# Patient Record
Sex: Female | Born: 1985 | Race: Black or African American | Hispanic: No | Marital: Single | Smoking: Current some day smoker
Health system: Southern US, Community
[De-identification: ages and names within clinical notes are randomized; demographics above are authoritative.]

## PROBLEM LIST (undated history)

## (undated) ENCOUNTER — Inpatient Hospital Stay (HOSPITAL_COMMUNITY): Payer: Self-pay

## (undated) DIAGNOSIS — A749 Chlamydial infection, unspecified: Secondary | ICD-10-CM

## (undated) DIAGNOSIS — Z789 Other specified health status: Secondary | ICD-10-CM

## (undated) HISTORY — PX: WISDOM TOOTH EXTRACTION: SHX21

---
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
## 2010-08-31 ENCOUNTER — Emergency Department (HOSPITAL_COMMUNITY)
Admission: EM | Admit: 2010-08-31 | Discharge: 2010-08-31 | Disposition: A | Discharge status: Home / Self Care | Payer: Self-pay | Attending: Emergency Medicine | Admitting: Emergency Medicine

## 2010-08-31 DIAGNOSIS — L259 Unspecified contact dermatitis, unspecified cause: Secondary | ICD-10-CM | POA: Insufficient documentation

## 2013-05-12 NOTE — L&D Delivery Note (Signed)
Delivery Note At 12:58 AM a viable female was delivered via Vaginal, Spontaneous Delivery (Presentation: ; Occiput Anterior).  APGAR: 8, 9; weight 8 lb 5.2 oz (3775 g).   Placenta status: Intact, Spontaneous.  Cord: 3 vessels with the following complications: None.  Anesthesia: Epidural  Episiotomy: None Lacerations: 2nd degree;Perineal Suture Repair: 3.0 vicryl Est. Blood Loss (mL): 250  Mom to postpartum.  Baby to Couplet care / Skin to Skin.  William Dalton 01/03/2014, 3:28 AM

## 2013-05-12 NOTE — L&D Delivery Note (Signed)
Attestation of Attending Supervision of Advanced Practitioner (CNM/NP): Evaluation and management procedures were performed by the Advanced Practitioner under my supervision and collaboration.  I have reviewed the Advanced Practitioner's note and chart, and I agree with the management and plan.  Damian Hofstra 01/09/2014 8:50 AM   

## 2013-08-04 ENCOUNTER — Inpatient Hospital Stay (HOSPITAL_COMMUNITY)
Admission: AD | Admit: 2013-08-04 | Discharge: 2013-08-04 | Disposition: A | Discharge status: Home / Self Care | Payer: Self-pay | Source: Ambulatory Visit | Attending: Obstetrics & Gynecology | Admitting: Obstetrics & Gynecology

## 2013-08-04 ENCOUNTER — Encounter (HOSPITAL_COMMUNITY): Payer: Self-pay | Admitting: *Deleted

## 2013-08-04 ENCOUNTER — Inpatient Hospital Stay (HOSPITAL_COMMUNITY)
Admission: AD | Admit: 2013-08-04 | Discharge: 2013-08-04 | Disposition: A | Discharge status: Home / Self Care | Payer: Medicaid Other | Source: Ambulatory Visit | Attending: Obstetrics & Gynecology | Admitting: Obstetrics & Gynecology

## 2013-08-04 DIAGNOSIS — O9989 Other specified diseases and conditions complicating pregnancy, childbirth and the puerperium: Principal | ICD-10-CM

## 2013-08-04 DIAGNOSIS — O99891 Other specified diseases and conditions complicating pregnancy: Secondary | ICD-10-CM | POA: Insufficient documentation

## 2013-08-04 DIAGNOSIS — Z3201 Encounter for pregnancy test, result positive: Secondary | ICD-10-CM

## 2013-08-04 DIAGNOSIS — N912 Amenorrhea, unspecified: Secondary | ICD-10-CM

## 2013-08-04 DIAGNOSIS — O99330 Smoking (tobacco) complicating pregnancy, unspecified trimester: Secondary | ICD-10-CM | POA: Insufficient documentation

## 2013-08-04 DIAGNOSIS — R109 Unspecified abdominal pain: Secondary | ICD-10-CM | POA: Insufficient documentation

## 2013-08-04 LAB — URINALYSIS, ROUTINE W REFLEX MICROSCOPIC
Bilirubin Urine: NEGATIVE
GLUCOSE, UA: NEGATIVE mg/dL
Hgb urine dipstick: NEGATIVE
KETONES UR: 15 mg/dL — AB
Leukocytes, UA: NEGATIVE
Nitrite: NEGATIVE
Protein, ur: NEGATIVE mg/dL
Specific Gravity, Urine: >1.03 — ABNORMAL HIGH (ref 1.005–1.030)
Urobilinogen, UA: 0.2 mg/dL (ref 0.0–1.0)
pH: 6 (ref 5.0–8.0)

## 2013-08-04 LAB — POCT PREGNANCY, URINE: Preg Test, Ur: POSITIVE — AB

## 2013-08-04 NOTE — Discharge Instructions (Signed)
Pregnancy Tests HOW DO PREGNANCY TESTS WORK? All pregnancy tests look for a special hormone in the urine or blood that is only present in pregnant women. This hormone, human chorionic gonadotropin (hCG), is also called the pregnancy hormone.  WHAT IS THE DIFFERENCE BETWEEN A URINE AND A BLOOD PREGNANCY TEST? IS ONE BETTER THAN THE OTHER? There are two types of pregnancy tests.  Blood tests.  Urine tests. Both tests look for the presence of hCG, the pregnancy hormone. Many women use a urine test or home pregnancy test (HPT) to find out if they are pregnant. HPTs are cheap, easy to use, can be done at home, and are private. When a woman has a positive result on an HPT, she needs to see her caregiver right away. The caregiver can confirm a positive HPT result with another urine test, a blood test, ultrasound, and a pelvic exam.  There are two types of blood tests you can get from a caregiver.   A quantitative blood test (or the beta hCG test). This test measures the exact amount of hCG in the blood. This means it can pick up very small amounts of hCG, making it a very accurate test.  A qualitative hCG blood test. This test gives a simple yes or no answer to whether you are pregnant. This test is more like a urine test in terms of its accuracy. Blood tests can pick up hCG earlier in a pregnancy than urine tests can. Blood tests can tell if you are pregnant about 6 to 8 days after you release an egg from an ovary (ovulate). Urine tests can determine pregnancy about 2 weeks after ovulation.  HOW IS A HOME PREGNANCY TEST DONE?  There are many types of home pregnancy tests or HPTs that can be bought over-the-counter at drug or discount stores.   Some involve collecting your urine in a cup and dipping a stick into the urine or putting some of the urine into a special container with an eyedropper.  Others are done by placing a stick into your urine stream.  Tests vary in how long you need to wait for  the stick or container to turn a certain color or have a symbol on it (like a plus or a minus).  All tests come with written instructions. Most tests also have toll-free phone numbers to call if you have any questions about how to do the test or read the results. HOW ACCURATE ARE HOME PREGNANCY TESTS?  HPTs are very accurate. Most brands of HPTs say they are 97% to 99% accurate when taken 1 week after missing your menstrual period, but this can vary with actual use. Each brand varies in how sensitive it is in picking up the pregnancy hormone hCG. If a test is not done correctly, it will be less accurate. Always check the package to make sure it is not past its expiration date. If it is, it will not be accurate. Most brands of HPTs tell users to do the test again in a few days, no matter what the results.  If you use an HPT too early in your pregnancy, you may not have enough of the pregnancy hormone hCG in your urine to have a positive test result. Most HPTs will be accurate if you test yourself around the time your period is due (about 2 weeks after you ovulate). You can get a negative test result if you are not pregnant or if you ovulated later than you thought you did.  You may also have problems with the pregnancy, which affects the amount of hCG you have in your urine. If your HPT is negative, test yourself again within a few days to 1 week. If you keep getting a negative result and think you are pregnant, talk with your caregiver right away about getting a blood pregnancy test.  FALSE POSITIVE PREGNANCY TEST A false positive HPT can happen if there is blood or protein present in your urine. A false positive can also happen if you were recently pregnant or if you take a pregnancy test too soon after taking fertility drug that contains hCG. Also, some prescription medicines such as water pills (diuretics), tranquilizers, seizure medicines, psychiatric medicines, and allergy and nausea medicines  (promethazine) give false positive readings. FALSE NEGATIVE PREGNANCY TEST  A false negative HPT can happen if you do the test too early. Try to wait until you are at least 1 day late for your menstrual period.  It may happen if you wait too long to test the urine (longer than 15 minutes).  It may also happen if the urine is too diluted because you drank a lot of fluids before getting the urine sample. It is best to test the first morning urine after you get out of bed. If your menstrual period did not start after a week of a negative HPT, repeat the pregnancy test. CAN ANYTHING INTERFERE WITH HOME PREGNANCY TEST RESULTS?  Most medicines, both over-the-counter and prescription drugs, including birth control pills and antibiotics, should not affect the results of a HPT. Only those drugs that have the pregnancy hormone hCG in them can give a false positive test result. Drugs that have hCG in them may be used for treating infertility (not being able to get pregnant). Alcohol and illegal drugs do not affect HPT results, but you should not be using these substances if you are trying to get pregnant. If you have a positive pregnancy test, call your caregiver to make an appointment to begin prenatal care. Document Released: 05/01/2003 Document Revised: 07/21/2011 Document Reviewed: 07/12/2010 [facility] Patient Information 2014 [place], [place].  Second Trimester of Pregnancy The second trimester is from week 13 through week 28, months 4 through 6. The second trimester is often a time when you feel your best. Your body has also adjusted to being pregnant, and you begin to feel better physically. Usually, morning sickness has lessened or quit completely, you may have more energy, and you may have an increase in appetite. The second trimester is also a time when the fetus is growing rapidly. At the end of the sixth month, the fetus is about 9 inches long and weighs about 1 pounds. You will likely begin to feel  the baby move (quickening) between 18 and 20 weeks of the pregnancy. BODY CHANGES Your body goes through many changes during pregnancy. The changes vary from woman to woman.   Your weight will continue to increase. You will notice your lower abdomen bulging out.  You may begin to get stretch marks on your hips, abdomen, and breasts.  You may develop headaches that can be relieved by medicines approved by your caregiver.  You may urinate more often because the fetus is pressing on your bladder.  You may develop or continue to have heartburn as a result of your pregnancy.  You may develop constipation because certain hormones are causing the muscles that push waste through your intestines to slow down.  You may develop hemorrhoids or swollen, bulging veins (varicose  veins).  You may have back pain because of the weight gain and pregnancy hormones relaxing your joints between the bones in your pelvis and as a result of a shift in weight and the muscles that support your balance.  Your breasts will continue to grow and be tender.  Your gums may bleed and may be sensitive to brushing and flossing.  Dark spots or blotches (chloasma, mask of pregnancy) may develop on your face. This will likely fade after the baby is born.  A dark line from your belly button to the pubic area (linea nigra) may appear. This will likely fade after the baby is born. WHAT TO EXPECT AT YOUR PRENATAL VISITS During a routine prenatal visit:  You will be weighed to make sure you and the fetus are growing normally.  Your blood pressure will be taken.  Your abdomen will be measured to track your baby's growth.  The fetal heartbeat will be listened to.  Any test results from the previous visit will be discussed. Your caregiver may ask you:  How you are feeling.  If you are feeling the baby move.  If you have had any abnormal symptoms, such as leaking fluid, bleeding, severe headaches, or abdominal  cramping.  If you have any questions. Other tests that may be performed during your second trimester include:  Blood tests that check for:  Low iron levels (anemia).  Gestational diabetes (between 24 and 28 weeks).  Rh antibodies.  Urine tests to check for infections, diabetes, or protein in the urine.  An ultrasound to confirm the proper growth and development of the baby.  An amniocentesis to check for possible genetic problems.  Fetal screens for spina bifida and Down syndrome. HOME CARE INSTRUCTIONS   Avoid all smoking, herbs, alcohol, and unprescribed drugs. These chemicals affect the formation and growth of the baby.  Follow your caregiver's instructions regarding medicine use. There are medicines that are either safe or unsafe to take during pregnancy.  Exercise only as directed by your caregiver. Experiencing uterine cramps is a good sign to stop exercising.  Continue to eat regular, healthy meals.  Wear a good support bra for breast tenderness.  Do not use hot tubs, steam rooms, or saunas.  Wear your seat belt at all times when driving.  Avoid raw meat, uncooked cheese, cat litter boxes, and soil used by cats. These carry germs that can cause birth defects in the baby.  Take your prenatal vitamins.  Try taking a stool softener (if your caregiver approves) if you develop constipation. Eat more high-fiber foods, such as fresh vegetables or fruit and whole grains. Drink plenty of fluids to keep your urine clear or pale yellow.  Take warm sitz baths to soothe any pain or discomfort caused by hemorrhoids. Use hemorrhoid cream if your caregiver approves.  If you develop varicose veins, wear support hose. Elevate your feet for 15 minutes, 3 4 times a day. Limit salt in your diet.  Avoid heavy lifting, wear low heel shoes, and practice good posture.  Rest with your legs elevated if you have leg cramps or low back pain.  Visit your dentist if you have not gone yet  during your pregnancy. Use a soft toothbrush to brush your teeth and be gentle when you floss.  A sexual relationship may be continued unless your caregiver directs you otherwise.  Continue to go to all your prenatal visits as directed by your caregiver. SEEK MEDICAL CARE IF:   You have dizziness.  You have mild pelvic cramps, pelvic pressure, or nagging pain in the abdominal area.  You have persistent nausea, vomiting, or diarrhea.  You have a bad smelling vaginal discharge.  You have pain with urination. SEEK IMMEDIATE MEDICAL CARE IF:   You have a fever.  You are leaking fluid from your vagina.  You have spotting or bleeding from your vagina.  You have severe abdominal cramping or pain.  You have rapid weight gain or loss.  You have shortness of breath with chest pain.  You notice sudden or extreme swelling of your face, hands, ankles, feet, or legs.  You have not felt your baby move in over an hour.  You have severe headaches that do not go away with medicine.  You have vision changes. Document Released: 04/22/2001 Document Revised: 12/29/2012 Document Reviewed: 06/29/2012 [facility] Patient Information 2014 [place], [place].

## 2013-08-04 NOTE — MAU Provider Note (Signed)
  History     CSN: [id number]  Arrival date and time: 08/04/13 1439   First Provider Initiated Contact with Patient 08/04/13 1510      Chief Complaint  Patient presents with  . Amenorrhea   HPI This is a 28 y.o. female at 8117 wks by LMP who presents not knowing she was pregnant, but c/o no period since November. UPTs at home neg, but has not done one lately . Is here mainly "to get a pap smear and breast exam [facility]".  Declines STD testing or pelvic exam right now. Tells me she has no pain or bleeding.   RN Note:  Patient states she has not had a period since November. Has done 2 pregnancy tests at home but are negative. Has mild cramping, no bleeding or discharge.        OB History   Grav Para Term Preterm Abortions TAB SAB Ect Mult Living   3    2  2          History reviewed. No pertinent past medical history.  Past Surgical History  Procedure Laterality Date  . Wisdom tooth extraction      Family History  Problem Relation Age of Onset  . Hypertension Mother   . Diabetes Maternal Grandmother   . Hypertension Maternal Grandmother     History  Substance Use Topics  . Smoking status: Current Some Day Smoker    Types: Cigarettes  . Smokeless tobacco: Not on file  . Alcohol Use: No    Allergies: No Known Allergies  No prescriptions prior to admission    Review of Systems  Constitutional: Negative for fever, chills and malaise/fatigue.  Gastrointestinal: Negative for nausea, vomiting, abdominal pain, diarrhea and constipation.  Genitourinary: Negative for dysuria.  Neurological: Negative for dizziness and headaches.   Physical Exam   Blood pressure 123/78, pulse 101, temperature 98.3 F (36.8 C), temperature source Oral, resp. rate 18, height 5' 4.5" (1.638 m), weight 98.249 kg (216 lb 9.6 oz), last menstrual period 04/07/2013, SpO2 100.00%.  Physical Exam  Constitutional: She is oriented to person,  place, and time. She appears well-developed and well-nourished. No distress.  HENT:  Head: Normocephalic.  Cardiovascular: Normal rate.   Respiratory: Effort normal.  GI: Soft. There is no tenderness. There is no rebound and no guarding.  Genitourinary:  Pelvic exam declined  Musculoskeletal: Normal range of motion.  Neurological: She is alert and oriented to person, place, and time.  Skin: Skin is warm and dry.  Psychiatric: She has a normal mood and affect.    MAU Course  Procedures  MDM FHTs audible with doppler.   Assessment and Plan  A:  SIUP at 6740w0d by LMP       + fetal heart tones        Unsure dates  P:   US scheduled for 08/11/13 at 2pm        Will get her into clinic for prenatal care        Proof of pregnancy letter given  [facility] LLC[facility]ILLIAMS,MARIE 08/04/2013, 3:27 PM

## 2013-08-04 NOTE — MAU Note (Signed)
Patient states she has not had a period since November. Has done 2 pregnancy tests at home but are negative. Has mild cramping, no bleeding or discharge.

## 2013-08-05 NOTE — MAU Provider Note (Signed)
Attestation of Attending Supervision of Advanced Practitioner (CNM/NP): Evaluation and management procedures were performed by the Advanced Practitioner under my supervision and collaboration.  I have reviewed the Advanced Practitioner's note and chart, and I agree with the management and plan.  HARRAWAY-SMITH, Zedekiah Hinderman 11:03 AM     

## 2013-08-09 ENCOUNTER — Ambulatory Visit (INDEPENDENT_AMBULATORY_CARE_PROVIDER_SITE_OTHER): Payer: Medicaid Other | Admitting: Advanced Practice Midwife

## 2013-08-09 ENCOUNTER — Encounter: Payer: Self-pay | Admitting: Advanced Practice Midwife

## 2013-08-09 VITALS — BP 131/81 | Temp 99.1°F | Wt 216.0 lb

## 2013-08-09 DIAGNOSIS — Z23 Encounter for immunization: Secondary | ICD-10-CM

## 2013-08-09 DIAGNOSIS — Z3490 Encounter for supervision of normal pregnancy, unspecified, unspecified trimester: Secondary | ICD-10-CM

## 2013-08-09 DIAGNOSIS — Z3480 Encounter for supervision of other normal pregnancy, unspecified trimester: Secondary | ICD-10-CM

## 2013-08-09 LAB — POCT URINALYSIS DIP (DEVICE)
BILIRUBIN URINE: NEGATIVE
GLUCOSE, UA: NEGATIVE mg/dL
Hgb urine dipstick: NEGATIVE
Nitrite: NEGATIVE
Protein, ur: 30 mg/dL — AB
SPECIFIC GRAVITY, URINE: 1.02 (ref 1.005–1.030)
Urobilinogen, UA: 0.2 mg/dL (ref 0.0–1.0)
pH: 7 (ref 5.0–8.0)

## 2013-08-09 LAB — GLUCOSE TOLERANCE, 1 HOUR (50G) W/O FASTING: Glucose, 1 Hour GTT: 95 mg/dL (ref 70–140)

## 2013-08-09 LAB — OB RESULTS CONSOLE GC/CHLAMYDIA
Chlamydia: POSITIVE
GC PROBE AMP, GENITAL: NEGATIVE

## 2013-08-09 MED ORDER — PRENATAL VITAMINS 28-0.8 MG PO TABS: 1.0000 | ORAL_TABLET | Freq: Every day | ORAL | Status: DC

## 2013-08-09 NOTE — Progress Notes (Signed)
   Subjective:    Meagan Stephens is a [id number] [id number] being seen today for her first obstetrical visit.  Her obstetrical history is significant for TAB x2 in 2012.  This pregnancy is unplanned, pt unaware of pregnancy until MAU visit at 17 weeks. . Patient is unsure if she intends to breast feed. Pregnancy history fully reviewed.  Patient reports no complaints.  Filed Vitals:   08/09/13 1314  BP: 131/81  Temp: 99.1 F (37.3 C)  Weight: 216 lb (97.977 kg)    HISTORY: OB History  Gravida Para Term Preterm AB SAB TAB Ectopic Multiple Living  3    2 0 2       # Outcome Date GA Lbr Len/2nd Weight Sex Delivery Anes PTL Lv  3 CUR           2 TAB 2012          1 TAB 2012             History reviewed. No pertinent past medical history. Past Surgical History  Procedure Laterality Date  . Wisdom tooth extraction     Family History  Problem Relation Age of Onset  . Hypertension Mother   . Diabetes Maternal Grandmother   . Hypertension Maternal Grandmother      Exam    Uterus:   1 cm below umbilicus  Pelvic Exam:    Perineum: No Hemorrhoids, Normal Perineum   Vulva: normal   Vagina:  normal mucosa, normal discharge   pH:    Cervix: no bleeding following Pap, no cervical motion tenderness, no lesions and nulliparous appearance   Adnexa: normal adnexa and no mass, fullness, tenderness   Bony Pelvis: average  System: Breast:  normal appearance, no masses or tenderness, Normal to palpation without dominant masses, Taught monthly breast self examination   Skin: normal coloration and turgor, no rashes    Neurologic: oriented, normal, gait normal; reflexes normal and symmetric   Extremities: normal strength, tone, and muscle mass, ROM of all joints is normal   HEENT neck supple with midline trachea and thyroid without masses   Mouth/Teeth mucous membranes moist, pharynx normal without lesions   Neck supple and no masses   Cardiovascular: regular rate and rhythm   Respiratory:   appears well, vitals normal, no respiratory distress, acyanotic, normal RR, ear and throat exam is normal, neck free of mass or lymphadenopathy, chest clear, no wheezing, crepitations, rhonchi, normal symmetric air entry   Abdomen: soft, non-tender; bowel sounds normal; no masses,  no organomegaly   Urinary: urethral meatus normal      Assessment:    Pregnancy: [id number] Patient Active Problem List   Diagnosis Date Noted  . Flu vaccine need 08/09/2013  . Supervision of normal pregnancy 08/09/2013        Plan:     Initial labs drawn. Prenatal vitamins. Problem list reviewed and updated. Genetic Screening discussed Quad Screen: declined.  Ultrasound discussed; fetal survey: ordered.  Follow up in 4 weeks. 50% of 30 min visit spent on counseling and coordination of care.     Stephens, Meagan Raboin 08/09/2013

## 2013-08-09 NOTE — Progress Notes (Signed)
9=97 initial prenatal visit, new pamphlet given. Labs today.

## 2013-08-10 ENCOUNTER — Other Ambulatory Visit: Payer: Self-pay | Admitting: Advanced Practice Midwife

## 2013-08-10 ENCOUNTER — Encounter: Payer: Self-pay | Admitting: Advanced Practice Midwife

## 2013-08-10 DIAGNOSIS — A749 Chlamydial infection, unspecified: Secondary | ICD-10-CM | POA: Insufficient documentation

## 2013-08-10 DIAGNOSIS — O98812 Other maternal infectious and parasitic diseases complicating pregnancy, second trimester: Secondary | ICD-10-CM

## 2013-08-10 LAB — OBSTETRIC PANEL
ANTIBODY SCREEN: NEGATIVE
Basophils Absolute: 0 10*3/uL (ref 0.0–0.1)
Basophils Relative: 0 % (ref 0–1)
EOS PCT: 1 % (ref 0–5)
Eosinophils Absolute: 0.1 10*3/uL (ref 0.0–0.7)
HEMATOCRIT: 34.4 % — AB (ref 36.0–46.0)
HEMOGLOBIN: 11.2 g/dL — AB (ref 12.0–15.0)
Hepatitis B Surface Ag: NEGATIVE
LYMPHS ABS: 2 10*3/uL (ref 0.7–4.0)
LYMPHS PCT: 25 % (ref 12–46)
MCH: 27.6 pg (ref 26.0–34.0)
MCHC: 32.6 g/dL (ref 30.0–36.0)
MCV: 84.7 fL (ref 78.0–100.0)
MONO ABS: 0.5 10*3/uL (ref 0.1–1.0)
Monocytes Relative: 6 % (ref 3–12)
Neutro Abs: 5.4 10*3/uL (ref 1.7–7.7)
Neutrophils Relative %: 68 % (ref 43–77)
Platelets: 217 10*3/uL (ref 150–400)
RBC: 4.06 MIL/uL (ref 3.87–5.11)
RDW: 13.8 % (ref 11.5–15.5)
RUBELLA: 2.73 {index} — AB (ref <0.90)
Rh Type: POSITIVE
WBC: 8 10*3/uL (ref 4.0–10.5)

## 2013-08-10 LAB — HIV ANTIBODY (ROUTINE TESTING W REFLEX): HIV: NONREACTIVE

## 2013-08-10 MED ORDER — AZITHROMYCIN 500 MG PO TABS: 1000.0000 mg | ORAL_TABLET | Freq: Once | ORAL | Status: DC

## 2013-08-11 ENCOUNTER — Ambulatory Visit (HOSPITAL_COMMUNITY)
Admit: 2013-08-11 | Discharge: 2013-08-11 | Disposition: A | Discharge status: Home / Self Care | Payer: Medicaid Other | Attending: Advanced Practice Midwife | Admitting: Advanced Practice Midwife

## 2013-08-11 DIAGNOSIS — N912 Amenorrhea, unspecified: Secondary | ICD-10-CM

## 2013-08-11 DIAGNOSIS — Z3689 Encounter for other specified antenatal screening: Secondary | ICD-10-CM | POA: Diagnosis present

## 2013-08-11 LAB — CULTURE, OB URINE

## 2013-08-11 LAB — HEMOGLOBINOPATHY EVALUATION
HGB F QUANT: 0 % (ref 0.0–2.0)
HGB S QUANTITAION: 0 %
Hemoglobin Other: 0 %
Hgb A2 Quant: 2.4 % (ref 2.2–3.2)
Hgb A: 97.6 % (ref 96.8–97.8)

## 2013-08-11 NOTE — Progress Notes (Signed)
Attempted to call pt. No answer. Left message stating we are calling with results and of information of a RX we have had sent to your pharmacy, please call clinic.

## 2013-08-12 ENCOUNTER — Encounter: Payer: Self-pay | Admitting: Advanced Practice Midwife

## 2013-08-16 LAB — PRESCRIPTION MONITORING PROFILE (19 PANEL)
Amphetamine/Meth: NEGATIVE ng/mL
BARBITURATE SCREEN, URINE: NEGATIVE ng/mL
BENZODIAZEPINE SCREEN, URINE: NEGATIVE ng/mL
Buprenorphine, Urine: NEGATIVE ng/mL
CARISOPRODOL, URINE: NEGATIVE ng/mL
COCAINE METABOLITES: NEGATIVE ng/mL
Creatinine, Urine: 305.95 mg/dL (ref >20.0)
ECSTASY: NEGATIVE ng/mL
Fentanyl, Ur: NEGATIVE ng/mL
Meperidine, Ur: NEGATIVE ng/mL
Methadone Screen, Urine: NEGATIVE ng/mL
Methaqualone: NEGATIVE ng/mL
Nitrites, Initial: NEGATIVE ug/mL
OPIATE SCREEN, URINE: NEGATIVE ng/mL
Oxycodone Screen, Ur: NEGATIVE ng/mL
Phencyclidine, Ur: NEGATIVE ng/mL
Propoxyphene: NEGATIVE ng/mL
TRAMADOL UR: NEGATIVE ng/mL
Tapentadol, urine: NEGATIVE ng/mL
Zolpidem, Urine: NEGATIVE ng/mL
pH, Initial: 8 pH (ref 4.5–8.9)

## 2013-08-16 LAB — CANNABANOIDS (GC/LC/MS), URINE: THC-COOH UR CONFIRM: 83 ng/mL — AB

## 2013-08-16 NOTE — Progress Notes (Signed)
Called pt. And informed her of positive chlamydia. Pt. States she already picked up azithromycin and took it. Informed pt. Not to have sexual intercourse until partner is treated. Pt. Verbalized understanding and gratitude and had no further questions or concerns.

## 2013-09-06 ENCOUNTER — Encounter: Payer: Self-pay | Admitting: Obstetrics and Gynecology

## 2013-09-06 ENCOUNTER — Ambulatory Visit (INDEPENDENT_AMBULATORY_CARE_PROVIDER_SITE_OTHER): Payer: Medicaid Other | Admitting: Obstetrics and Gynecology

## 2013-09-06 VITALS — BP 121/83 | HR 109 | Temp 98.2°F | Wt 214.8 lb

## 2013-09-06 DIAGNOSIS — Z3490 Encounter for supervision of normal pregnancy, unspecified, unspecified trimester: Secondary | ICD-10-CM

## 2013-09-06 DIAGNOSIS — Z3480 Encounter for supervision of other normal pregnancy, unspecified trimester: Secondary | ICD-10-CM

## 2013-09-06 LAB — POCT URINALYSIS DIP (DEVICE)
Bilirubin Urine: NEGATIVE
Glucose, UA: NEGATIVE mg/dL
HGB URINE DIPSTICK: NEGATIVE
Ketones, ur: 15 mg/dL — AB
Nitrite: NEGATIVE
PROTEIN: NEGATIVE mg/dL
SPECIFIC GRAVITY, URINE: 1.02 (ref 1.005–1.030)
Urobilinogen, UA: 0.2 mg/dL (ref 0.0–1.0)
pH: 6.5 (ref 5.0–8.0)

## 2013-09-06 NOTE — Progress Notes (Signed)
C/o panties wet 2 days ago- felt like leaked. Also c/o when brushes teeth- makes her gag- we discussed not brushing tongue, and avoiding going to back of throat.

## 2013-09-06 NOTE — Patient Instructions (Signed)
Second Trimester of Pregnancy The second trimester is from week 13 through week 28, months 4 through 6. The second trimester is often a time when you feel your best. Your body has also adjusted to being pregnant, and you begin to feel better physically. Usually, morning sickness has lessened or quit completely, you may have more energy, and you may have an increase in appetite. The second trimester is also a time when the fetus is growing rapidly. At the end of the sixth month, the fetus is about 9 inches long and weighs about 1 pounds. You will likely begin to feel the baby move (quickening) between 18 and 20 weeks of the pregnancy. BODY CHANGES Your body goes through many changes during pregnancy. The changes vary from woman to woman.   Your weight will continue to increase. You will notice your lower abdomen bulging out.  You may begin to get stretch marks on your hips, abdomen, and breasts.  You may develop headaches that can be relieved by medicines approved by your caregiver.  You may urinate more often because the fetus is pressing on your bladder.  You may develop or continue to have heartburn as a result of your pregnancy.  You may develop constipation because certain hormones are causing the muscles that push waste through your intestines to slow down.  You may develop hemorrhoids or swollen, bulging veins (varicose veins).  You may have back pain because of the weight gain and pregnancy hormones relaxing your joints between the bones in your pelvis and as a result of a shift in weight and the muscles that support your balance.  Your breasts will continue to grow and be tender.  Your gums may bleed and may be sensitive to brushing and flossing.  Dark spots or blotches (chloasma, mask of pregnancy) may develop on your face. This will likely fade after the baby is born.  A dark line from your belly button to the pubic area (linea nigra) may appear. This will likely fade after the  baby is born. WHAT TO EXPECT AT YOUR PRENATAL VISITS During a routine prenatal visit:  You will be weighed to make sure you and the fetus are growing normally.  Your blood pressure will be taken.  Your abdomen will be measured to track your baby's growth.  The fetal heartbeat will be listened to.  Any test results from the previous visit will be discussed. Your caregiver may ask you:  How you are feeling.  If you are feeling the baby move.  If you have had any abnormal symptoms, such as leaking fluid, bleeding, severe headaches, or abdominal cramping.  If you have any questions. Other tests that may be performed during your second trimester include:  Blood tests that check for:  Low iron levels (anemia).  Gestational diabetes (between 24 and 28 weeks).  Rh antibodies.  Urine tests to check for infections, diabetes, or protein in the urine.  An ultrasound to confirm the proper growth and development of the baby.  An amniocentesis to check for possible genetic problems.  Fetal screens for spina bifida and Down syndrome. HOME CARE INSTRUCTIONS   Avoid all smoking, herbs, alcohol, and unprescribed drugs. These chemicals affect the formation and growth of the baby.  Follow your caregiver's instructions regarding medicine use. There are medicines that are either safe or unsafe to take during pregnancy.  Exercise only as directed by your caregiver. Experiencing uterine cramps is a good sign to stop exercising.  Continue to eat regular,   healthy meals.  Wear a good support bra for breast tenderness.  Do not use hot tubs, steam rooms, or saunas.  Wear your seat belt at all times when driving.  Avoid raw meat, uncooked cheese, cat litter boxes, and soil used by cats. These carry germs that can cause birth defects in the baby.  Take your prenatal vitamins.  Try taking a stool softener (if your caregiver approves) if you develop constipation. Eat more high-fiber foods,  such as fresh vegetables or fruit and whole grains. Drink plenty of fluids to keep your urine clear or pale yellow.  Take warm sitz baths to soothe any pain or discomfort caused by hemorrhoids. Use hemorrhoid cream if your caregiver approves.  If you develop varicose veins, wear support hose. Elevate your feet for 15 minutes, 3 4 times a day. Limit salt in your diet.  Avoid heavy lifting, wear low heel shoes, and practice good posture.  Rest with your legs elevated if you have leg cramps or low back pain.  Visit your dentist if you have not gone yet during your pregnancy. Use a soft toothbrush to brush your teeth and be gentle when you floss.  A sexual relationship may be continued unless your caregiver directs you otherwise.  Continue to go to all your prenatal visits as directed by your caregiver. SEEK MEDICAL CARE IF:   You have dizziness.  You have mild pelvic cramps, pelvic pressure, or nagging pain in the abdominal area.  You have persistent nausea, vomiting, or diarrhea.  You have a bad smelling vaginal discharge.  You have pain with urination. SEEK IMMEDIATE MEDICAL CARE IF:   You have a fever.  You are leaking fluid from your vagina.  You have spotting or bleeding from your vagina.  You have severe abdominal cramping or pain.  You have rapid weight gain or loss.  You have shortness of breath with chest pain.  You notice sudden or extreme swelling of your face, hands, ankles, feet, or legs.  You have not felt your baby move in over an hour.  You have severe headaches that do not go away with medicine.  You have vision changes. Document Released: 04/22/2001 Document Revised: 12/29/2012 Document Reviewed: 06/29/2012 [employer] Patient Information 2014 [employer].  

## 2013-09-06 NOTE — Progress Notes (Signed)
Wetness in underwear 2 d ago, not since. SSE: no pooling, white discharge. CG/CT done for TOC.  Explained EDD based on US and long discussion re paternity. Reviewed US with pt. Discussed diet.

## 2013-09-07 LAB — GC/CHLAMYDIA PROBE AMP
CT Probe RNA: NEGATIVE
GC Probe RNA: NEGATIVE

## 2013-09-12 ENCOUNTER — Telehealth: Payer: Self-pay

## 2013-09-12 NOTE — Telephone Encounter (Signed)
Pt called and stated that she needed a refill on her prenatal vitamins.  I called pt and left message stating that prenatals vitamins are at her [facility] she go and pick them up.

## 2013-09-27 ENCOUNTER — Encounter: Payer: Self-pay | Admitting: Family Medicine

## 2013-09-27 ENCOUNTER — Telehealth: Payer: Self-pay | Admitting: *Deleted

## 2013-09-27 NOTE — Telephone Encounter (Signed)
Pt left message stating that she needs a letter for her job which states her restrictions. She states that she is not able to stand for long periods of time and wants that in the letter. Her next appt is 5/26 but needs the letter before then.

## 2013-10-04 ENCOUNTER — Ambulatory Visit (INDEPENDENT_AMBULATORY_CARE_PROVIDER_SITE_OTHER): Payer: Medicaid Other | Admitting: Family Medicine

## 2013-10-04 ENCOUNTER — Encounter: Payer: Self-pay | Admitting: Family Medicine

## 2013-10-04 VITALS — BP 122/78 | HR 101 | Temp 98.3°F | Wt 216.5 lb

## 2013-10-04 DIAGNOSIS — A749 Chlamydial infection, unspecified: Secondary | ICD-10-CM

## 2013-10-04 DIAGNOSIS — O98812 Other maternal infectious and parasitic diseases complicating pregnancy, second trimester: Secondary | ICD-10-CM

## 2013-10-04 DIAGNOSIS — Z3480 Encounter for supervision of other normal pregnancy, unspecified trimester: Secondary | ICD-10-CM

## 2013-10-04 DIAGNOSIS — O98819 Other maternal infectious and parasitic diseases complicating pregnancy, unspecified trimester: Secondary | ICD-10-CM

## 2013-10-04 DIAGNOSIS — Z3490 Encounter for supervision of normal pregnancy, unspecified, unspecified trimester: Secondary | ICD-10-CM

## 2013-10-04 LAB — POCT URINALYSIS DIP (DEVICE)
Bilirubin Urine: NEGATIVE
Glucose, UA: NEGATIVE mg/dL
HGB URINE DIPSTICK: NEGATIVE
Ketones, ur: NEGATIVE mg/dL
NITRITE: NEGATIVE
PH: 6 (ref 5.0–8.0)
Protein, ur: NEGATIVE mg/dL
Specific Gravity, Urine: 1.01 (ref 1.005–1.030)
UROBILINOGEN UA: 0.2 mg/dL (ref 0.0–1.0)

## 2013-10-04 NOTE — Progress Notes (Signed)
Pt. Cannot stay for an hour today to have 1hr gtt and labs drawn as she has family with broken leg waiting in that car-- to schedule lab appointment for this Thursday to have all labs done.

## 2013-10-04 NOTE — Progress Notes (Signed)
F/u US scheduled for heart views

## 2013-10-04 NOTE — Progress Notes (Signed)
S: 28 yo [id number] @ [redacted]w[redacted]d here for ROBV  - doing well.  - unable to stay for 1 hour today but can come back in 2 days - no ctx, lof, vb. +FM  O: see flowsheet  A/P - doing well - PTL precautions discussed - f/u in 2 days for 28 week labs and 2 weeks for visit

## 2013-10-04 NOTE — Patient Instructions (Signed)
Second Trimester of Pregnancy The second trimester is from week 13 through week 28, months 4 through 6. The second trimester is often a time when you feel your best. Your body has also adjusted to being pregnant, and you begin to feel better physically. Usually, morning sickness has lessened or quit completely, you may have more energy, and you may have an increase in appetite. The second trimester is also a time when the fetus is growing rapidly. At the end of the sixth month, the fetus is about 9 inches long and weighs about 1 pounds. You will likely begin to feel the baby move (quickening) between 18 and 20 weeks of the pregnancy. BODY CHANGES Your body goes through many changes during pregnancy. The changes vary from woman to woman.   Your weight will continue to increase. You will notice your lower abdomen bulging out.  You may begin to get stretch marks on your hips, abdomen, and breasts.  You may develop headaches that can be relieved by medicines approved by your caregiver.  You may urinate more often because the fetus is pressing on your bladder.  You may develop or continue to have heartburn as a result of your pregnancy.  You may develop constipation because certain hormones are causing the muscles that push waste through your intestines to slow down.  You may develop hemorrhoids or swollen, bulging veins (varicose veins).  You may have back pain because of the weight gain and pregnancy hormones relaxing your joints between the bones in your pelvis and as a result of a shift in weight and the muscles that support your balance.  Your breasts will continue to grow and be tender.  Your gums may bleed and may be sensitive to brushing and flossing.  Dark spots or blotches (chloasma, mask of pregnancy) may develop on your face. This will likely fade after the baby is born.  A dark line from your belly button to the pubic area (linea nigra) may appear. This will likely fade after the  baby is born. WHAT TO EXPECT AT YOUR PRENATAL VISITS During a routine prenatal visit:  You will be weighed to make sure you and the fetus are growing normally.  Your blood pressure will be taken.  Your abdomen will be measured to track your baby's growth.  The fetal heartbeat will be listened to.  Any test results from the previous visit will be discussed. Your caregiver may ask you:  How you are feeling.  If you are feeling the baby move.  If you have had any abnormal symptoms, such as leaking fluid, bleeding, severe headaches, or abdominal cramping.  If you have any questions. Other tests that may be performed during your second trimester include:  Blood tests that check for:  Low iron levels (anemia).  Gestational diabetes (between 24 and 28 weeks).  Rh antibodies.  Urine tests to check for infections, diabetes, or protein in the urine.  An ultrasound to confirm the proper growth and development of the baby.  An amniocentesis to check for possible genetic problems.  Fetal screens for spina bifida and Down syndrome. HOME CARE INSTRUCTIONS   Avoid all smoking, herbs, alcohol, and unprescribed drugs. These chemicals affect the formation and growth of the baby.  Follow your caregiver's instructions regarding medicine use. There are medicines that are either safe or unsafe to take during pregnancy.  Exercise only as directed by your caregiver. Experiencing uterine cramps is a good sign to stop exercising.  Continue to eat regular,   healthy meals.  Wear a good support bra for breast tenderness.  Do not use hot tubs, steam rooms, or saunas.  Wear your seat belt at all times when driving.  Avoid raw meat, uncooked cheese, cat litter boxes, and soil used by cats. These carry germs that can cause birth defects in the baby.  Take your prenatal vitamins.  Try taking a stool softener (if your caregiver approves) if you develop constipation. Eat more high-fiber foods,  such as fresh vegetables or fruit and whole grains. Drink plenty of fluids to keep your urine clear or pale yellow.  Take warm sitz baths to soothe any pain or discomfort caused by hemorrhoids. Use hemorrhoid cream if your caregiver approves.  If you develop varicose veins, wear support hose. Elevate your feet for 15 minutes, 3 4 times a day. Limit salt in your diet.  Avoid heavy lifting, wear low heel shoes, and practice good posture.  Rest with your legs elevated if you have leg cramps or low back pain.  Visit your dentist if you have not gone yet during your pregnancy. Use a soft toothbrush to brush your teeth and be gentle when you floss.  A sexual relationship may be continued unless your caregiver directs you otherwise.  Continue to go to all your prenatal visits as directed by your caregiver. SEEK MEDICAL CARE IF:   You have dizziness.  You have mild pelvic cramps, pelvic pressure, or nagging pain in the abdominal area.  You have persistent nausea, vomiting, or diarrhea.  You have a bad smelling vaginal discharge.  You have pain with urination. SEEK IMMEDIATE MEDICAL CARE IF:   You have a fever.  You are leaking fluid from your vagina.  You have spotting or bleeding from your vagina.  You have severe abdominal cramping or pain.  You have rapid weight gain or loss.  You have shortness of breath with chest pain.  You notice sudden or extreme swelling of your face, hands, ankles, feet, or legs.  You have not felt your baby move in over an hour.  You have severe headaches that do not go away with medicine.  You have vision changes. Document Released: 04/22/2001 Document Revised: 12/29/2012 Document Reviewed: 06/29/2012 [employer] Patient Information 2014 [employer].  

## 2013-10-04 NOTE — Telephone Encounter (Addendum)
Per chart review it looks like a letter was already filled out. Patient here for OB appt today.

## 2013-10-06 ENCOUNTER — Ambulatory Visit (HOSPITAL_COMMUNITY)
Admission: RE | Admit: 2013-10-06 | Discharge: 2013-10-06 | Disposition: A | Discharge status: Home / Self Care | Payer: Medicaid Other | Source: Ambulatory Visit | Attending: Family Medicine | Admitting: Family Medicine

## 2013-10-06 ENCOUNTER — Other Ambulatory Visit: Payer: Self-pay

## 2013-10-06 DIAGNOSIS — Z3689 Encounter for other specified antenatal screening: Secondary | ICD-10-CM | POA: Diagnosis not present

## 2013-10-06 DIAGNOSIS — O98812 Other maternal infectious and parasitic diseases complicating pregnancy, second trimester: Secondary | ICD-10-CM

## 2013-10-06 DIAGNOSIS — Z3490 Encounter for supervision of normal pregnancy, unspecified, unspecified trimester: Secondary | ICD-10-CM

## 2013-10-06 DIAGNOSIS — A749 Chlamydial infection, unspecified: Secondary | ICD-10-CM

## 2013-10-07 ENCOUNTER — Other Ambulatory Visit: Payer: Self-pay

## 2013-10-07 ENCOUNTER — Encounter: Payer: Self-pay | Admitting: Family Medicine

## 2013-10-18 ENCOUNTER — Telehealth: Payer: Self-pay | Admitting: General Practice

## 2013-10-18 NOTE — Telephone Encounter (Signed)
Patient called and left message that she needs a refill on her prenatal vitamins. Per chart review patient was given 10 refills in march. Called patient at [phone], no answer- left message that we are trying to return your phone call, please call us back at the clinics

## 2013-10-19 ENCOUNTER — Other Ambulatory Visit: Payer: Self-pay

## 2013-10-19 NOTE — Telephone Encounter (Signed)
Called pt and left message stating that she has available refills on the medication requested. She may call and request from her pharmacy.

## 2013-10-21 ENCOUNTER — Encounter: Payer: Self-pay | Admitting: Advanced Practice Midwife

## 2013-10-21 ENCOUNTER — Ambulatory Visit (INDEPENDENT_AMBULATORY_CARE_PROVIDER_SITE_OTHER): Payer: Medicaid Other | Admitting: Advanced Practice Midwife

## 2013-10-21 VITALS — BP 115/78 | HR 100 | Temp 98.2°F | Wt 213.4 lb

## 2013-10-21 DIAGNOSIS — O98819 Other maternal infectious and parasitic diseases complicating pregnancy, unspecified trimester: Secondary | ICD-10-CM

## 2013-10-21 DIAGNOSIS — Z23 Encounter for immunization: Secondary | ICD-10-CM

## 2013-10-21 DIAGNOSIS — A749 Chlamydial infection, unspecified: Secondary | ICD-10-CM

## 2013-10-21 DIAGNOSIS — O98812 Other maternal infectious and parasitic diseases complicating pregnancy, second trimester: Principal | ICD-10-CM

## 2013-10-21 LAB — CBC
HCT: 33.5 % — ABNORMAL LOW (ref 36.0–46.0)
Hemoglobin: 11.3 g/dL — ABNORMAL LOW (ref 12.0–15.0)
MCH: 27.6 pg (ref 26.0–34.0)
MCHC: 33.7 g/dL (ref 30.0–36.0)
MCV: 81.9 fL (ref 78.0–100.0)
PLATELETS: 187 10*3/uL (ref 150–400)
RBC: 4.09 MIL/uL (ref 3.87–5.11)
RDW: 14.7 % (ref 11.5–15.5)
WBC: 7.6 10*3/uL (ref 4.0–10.5)

## 2013-10-21 LAB — POCT URINALYSIS DIP (DEVICE)
BILIRUBIN URINE: NEGATIVE
Glucose, UA: NEGATIVE mg/dL
HGB URINE DIPSTICK: NEGATIVE
NITRITE: NEGATIVE
PH: 7 (ref 5.0–8.0)
Protein, ur: NEGATIVE mg/dL
Specific Gravity, Urine: 1.015 (ref 1.005–1.030)
Urobilinogen, UA: 0.2 mg/dL (ref 0.0–1.0)

## 2013-10-21 MED ORDER — TETANUS-DIPHTH-ACELL PERTUSSIS 5-2.5-18.5 LF-MCG/0.5 IM SUSP: 0.5000 mL | Freq: Once | INTRAMUSCULAR | Status: DC

## 2013-10-21 NOTE — Progress Notes (Signed)
Doing well.  Good fetal movement, denies vaginal bleeding, LOF, regular contractions.  28 week labs today.  No urine results at time of visit.

## 2013-10-21 NOTE — Progress Notes (Signed)
1hr gtt and labs today.  

## 2013-10-22 LAB — RPR

## 2013-10-22 LAB — HIV ANTIBODY (ROUTINE TESTING W REFLEX): HIV 1&2 Ab, 4th Generation: NONREACTIVE

## 2013-10-22 LAB — GLUCOSE TOLERANCE, 1 HOUR (50G) W/O FASTING: Glucose, 1 Hour GTT: 125 mg/dL (ref 70–140)

## 2013-10-25 ENCOUNTER — Encounter: Payer: Self-pay | Admitting: General Practice

## 2013-11-07 ENCOUNTER — Encounter: Payer: Self-pay | Admitting: General Practice

## 2013-11-08 ENCOUNTER — Encounter: Payer: Medicaid Other | Admitting: Obstetrics and Gynecology

## 2013-11-14 ENCOUNTER — Telehealth: Payer: Self-pay | Admitting: *Deleted

## 2013-11-14 NOTE — Telephone Encounter (Signed)
Meagan Stephens left a message that she is calling about wanting to get registered for [facility] classes.  Called [facility] and asked if she was a previous patient of the health department and she was not. Gave her number for [facility] perinatal education classed ..Marland Kitchen

## 2013-11-17 ENCOUNTER — Telehealth: Payer: Self-pay | Admitting: General Practice

## 2013-11-17 NOTE — Telephone Encounter (Signed)
Patient called and left message stating she would like her records sent over to [facility]. Called patient, no answer- left message that I am trying to return her phone call and that in order for us to send her records over we need her to sign a release of information and that allows us to send her records to them and she can come by our office to fill that out or she can fill it out there and they will fax the form to us and then we can send her records. If she has any other questions or concerns she can call us back at the clinics

## 2013-11-23 ENCOUNTER — Ambulatory Visit (INDEPENDENT_AMBULATORY_CARE_PROVIDER_SITE_OTHER): Payer: Medicaid Other | Admitting: Family

## 2013-11-23 VITALS — BP 130/79 | HR 108 | Temp 97.6°F | Wt 214.4 lb

## 2013-11-23 DIAGNOSIS — Z3480 Encounter for supervision of other normal pregnancy, unspecified trimester: Secondary | ICD-10-CM

## 2013-11-23 DIAGNOSIS — Z3493 Encounter for supervision of normal pregnancy, unspecified, third trimester: Secondary | ICD-10-CM

## 2013-11-23 LAB — POCT URINALYSIS DIP (DEVICE)
Bilirubin Urine: NEGATIVE
Glucose, UA: NEGATIVE mg/dL
Hgb urine dipstick: NEGATIVE
Ketones, ur: NEGATIVE mg/dL
Nitrite: NEGATIVE
PROTEIN: 30 mg/dL — AB
SPECIFIC GRAVITY, URINE: 1.02 (ref 1.005–1.030)
Urobilinogen, UA: 0.2 mg/dL (ref 0.0–1.0)
pH: 7 (ref 5.0–8.0)

## 2013-11-23 MED ORDER — NITROFURANTOIN MONOHYD MACRO 100 MG PO CAPS: 100.0000 mg | ORAL_CAPSULE | Freq: Two times a day (BID) | ORAL | Status: DC

## 2013-11-23 NOTE — Patient Instructions (Signed)
Breastfeeding Deciding to breastfeed is one of the best choices you can make for you and your baby. A change in hormones during pregnancy causes your breast tissue to grow and increases the number and size of your milk ducts. These hormones also allow proteins, sugars, and fats from your blood supply to make breast milk in your milk-producing glands. Hormones prevent breast milk from being released before your baby is born as well as prompt milk flow after birth. Once breastfeeding has begun, thoughts of your baby, as well as his or her sucking or crying, can stimulate the release of milk from your milk-producing glands.  BENEFITS OF BREASTFEEDING For Your Baby  Your first milk (colostrum) helps your baby's digestive system function better.   There are antibodies in your milk that help your baby fight off infections.   Your baby has a lower incidence of asthma, allergies, and sudden infant death syndrome.   The nutrients in breast milk are better for your baby than infant formulas and are designed uniquely for your baby's needs.   Breast milk improves your baby's brain development.   Your baby is less likely to develop other conditions, such as childhood obesity, asthma, or type 2 diabetes mellitus.  For You   Breastfeeding helps to create a very special bond between you and your baby.   Breastfeeding is convenient. Breast milk is always available at the correct temperature and costs nothing.   Breastfeeding helps to burn calories and helps you lose the weight gained during pregnancy.   Breastfeeding makes your uterus contract to its prepregnancy size faster and slows bleeding (lochia) after you give birth.   Breastfeeding helps to lower your risk of developing type 2 diabetes mellitus, osteoporosis, and breast or ovarian cancer later in life. SIGNS THAT YOUR BABY IS HUNGRY Early Signs of Hunger  Increased alertness or activity.  Stretching.  Movement of the head from  side to side.  Movement of the head and opening of the mouth when the corner of the mouth or cheek is stroked (rooting).  Increased sucking sounds, smacking lips, cooing, sighing, or squeaking.  Hand-to-mouth movements.  Increased sucking of fingers or hands. Late Signs of Hunger  Fussing.  Intermittent crying. Extreme Signs of Hunger Signs of extreme hunger will require calming and consoling before your baby will be able to breastfeed successfully. Do not wait for the following signs of extreme hunger to occur before you initiate breastfeeding:   Restlessness.  A loud, strong cry.   Screaming. BREASTFEEDING BASICS Breastfeeding Initiation  Find a comfortable place to sit or lie down, with your neck and back well supported.  Place a pillow or rolled up blanket under your baby to bring him or her to the level of your breast (if you are seated). Nursing pillows are specially designed to help support your arms and your baby while you breastfeed.  Make sure that your baby's abdomen is facing your abdomen.   Gently massage your breast. With your fingertips, massage from your chest wall toward your nipple in a circular motion. This encourages milk flow. You may need to continue this action during the feeding if your milk flows slowly.  Support your breast with 4 fingers underneath and your thumb above your nipple. Make sure your fingers are well away from your nipple and your baby's mouth.   Stroke your baby's lips gently with your finger or nipple.   When your baby's mouth is open wide enough, quickly bring your baby to your   breast, placing your entire nipple and as much of the colored area around your nipple (areola) as possible into your baby's mouth.   More areola should be visible above your baby's upper lip than below the lower lip.   Your baby's tongue should be between his or her lower gum and your breast.   Ensure that your baby's mouth is correctly positioned  around your nipple (latched). Your baby's lips should create a seal on your breast and be turned out (everted).  It is common for your baby to suck about 2-3 minutes in order to start the flow of breast milk. Latching Teaching your baby how to latch on to your breast properly is very important. An improper latch can cause nipple pain and decreased milk supply for you and poor weight gain in your baby. Also, if your baby is not latched onto your nipple properly, he or she may swallow some air during feeding. This can make your baby fussy. Burping your baby when you switch breasts during the feeding can help to get rid of the air. However, teaching your baby to latch on properly is still the best way to prevent fussiness from swallowing air while breastfeeding. Signs that your baby has successfully latched on to your nipple:    Silent tugging or silent sucking, without causing you pain.   Swallowing heard between every 3-4 sucks.    Muscle movement above and in front of his or her ears while sucking.  Signs that your baby has not successfully latched on to nipple:   Sucking sounds or smacking sounds from your baby while breastfeeding.  Nipple pain. If you think your baby has not latched on correctly, slip your finger into the corner of your baby's mouth to break the suction and place it between your baby's gums. Attempt breastfeeding initiation again. Signs of Successful Breastfeeding Signs from your baby:   A gradual decrease in the number of sucks or complete cessation of sucking.   Falling asleep.   Relaxation of his or her body.   Retention of a small amount of milk in his or her mouth.   Letting go of your breast by himself or herself. Signs from you:  Breasts that have increased in firmness, weight, and size 1-3 hours after feeding.   Breasts that are softer immediately after breastfeeding.  Increased milk volume, as well as a change in milk consistency and color by  the fifth day of breastfeeding.   Nipples that are not sore, cracked, or bleeding. Signs That Your Baby is Getting Enough Milk  Wetting at least 3 diapers in a 24-hour period. The urine should be clear and pale yellow by age 5 days.  At least 3 stools in a 24-hour period by age 5 days. The stool should be soft and yellow.  At least 3 stools in a 24-hour period by age 7 days. The stool should be seedy and yellow.  No loss of weight greater than 10% of birth weight during the first 3 days of age.  Average weight gain of 4-7 ounces (113-198 g) per week after age 4 days.  Consistent daily weight gain by age 5 days, without weight loss after the age of 2 weeks. After a feeding, your baby may spit up a small amount. This is common. BREASTFEEDING FREQUENCY AND DURATION Frequent feeding will help you make more milk and can prevent sore nipples and breast engorgement. Breastfeed when you feel the need to reduce the fullness of your breasts   or when your baby shows signs of hunger. This is called "breastfeeding on demand." Avoid introducing a pacifier to your baby while you are working to establish breastfeeding (the first 4-6 weeks after your baby is born). After this time you may choose to use a pacifier. Research has shown that pacifier use during the first year of a baby's life decreases the risk of sudden infant death syndrome (SIDS). Allow your baby to feed on each breast as long as he or she wants. Breastfeed until your baby is finished feeding. When your baby unlatches or falls asleep while feeding from the first breast, offer the second breast. Because newborns are often sleepy in the first few weeks of life, you may need to awaken your baby to get him or her to feed. Breastfeeding times will vary from baby to baby. However, the following rules can serve as a guide to help you ensure that your baby is properly fed:  Newborns (babies 4 weeks of age or younger) may breastfeed every 1-3  hours.  Newborns should not go longer than 3 hours during the day or 5 hours during the night without breastfeeding.  You should breastfeed your baby a minimum of 8 times in a 24-hour period until you begin to introduce solid foods to your baby at around 6 months of age. BREAST MILK PUMPING Pumping and storing breast milk allows you to ensure that your baby is exclusively fed your breast milk, even at times when you are unable to breastfeed. This is especially important if you are going back to work while you are still breastfeeding or when you are not able to be present during feedings. Your lactation consultant can give you guidelines on how long it is safe to store breast milk.  A breast pump is a machine that allows you to pump milk from your breast into a sterile bottle. The pumped breast milk can then be stored in a refrigerator or freezer. Some breast pumps are operated by hand, while others use electricity. Ask your lactation consultant which type will work best for you. Breast pumps can be purchased, but some hospitals and breastfeeding support groups lease breast pumps on a monthly basis. A lactation consultant can teach you how to hand express breast milk, if you prefer not to use a pump.  CARING FOR YOUR BREASTS WHILE YOU BREASTFEED Nipples can become dry, cracked, and sore while breastfeeding. The following recommendations can help keep your breasts moisturized and healthy:  Avoid using soap on your nipples.   Wear a supportive bra. Although not required, special nursing bras and tank tops are designed to allow access to your breasts for breastfeeding without taking off your entire bra or top. Avoid wearing underwire-style bras or extremely tight bras.  Air dry your nipples for 3-4minutes after each feeding.   Use only cotton bra pads to absorb leaked breast milk. Leaking of breast milk between feedings is normal.   Use lanolin on your nipples after breastfeeding. Lanolin helps to  maintain your skin's normal moisture barrier. If you use pure lanolin, you do not need to wash it off before feeding your baby again. Pure lanolin is not toxic to your baby. You may also hand express a few drops of breast milk and gently massage that milk into your nipples and allow the milk to air dry. In the first few weeks after giving birth, some women experience extremely full breasts (engorgement). Engorgement can make your breasts feel heavy, warm, and tender to the   touch. Engorgement peaks within 3-5 days after you give birth. The following recommendations can help ease engorgement:  Completely empty your breasts while breastfeeding or pumping. You may want to start by applying warm, moist heat (in the shower or with warm water-soaked hand towels) just before feeding or pumping. This increases circulation and helps the milk flow. If your baby does not completely empty your breasts while breastfeeding, pump any extra milk after he or she is finished.  Wear a snug bra (nursing or regular) or tank top for 1-2 days to signal your body to slightly decrease milk production.  Apply ice packs to your breasts, unless this is too uncomfortable for you.  Make sure that your baby is latched on and positioned properly while breastfeeding. If engorgement persists after 48 hours of following these recommendations, contact your health care provider or a Advertising copywriter. OVERALL HEALTH CARE RECOMMENDATIONS WHILE BREASTFEEDING  Eat healthy foods. Alternate between meals and snacks, eating 3 of each per day. Because what you eat affects your breast milk, some of the foods may make your baby more irritable than usual. Avoid eating these foods if you are sure that they are negatively affecting your baby.  Drink milk, fruit juice, and water to satisfy your thirst (about 10 glasses a day).   Rest often, relax, and continue to take your prenatal vitamins to prevent fatigue, stress, and anemia.  Continue  breast self-awareness checks.  Avoid chewing and smoking tobacco.  Avoid alcohol and drug use. Some medicines that may be harmful to your baby can pass through breast milk. It is important to ask your health care provider before taking any medicine, including all over-the-counter and prescription medicine as well as vitamin and herbal supplements. It is possible to become pregnant while breastfeeding. If birth control is desired, ask your health care provider about options that will be safe for your baby. SEEK MEDICAL CARE IF:   You feel like you want to stop breastfeeding or have become frustrated with breastfeeding.  You have painful breasts or nipples.  Your nipples are cracked or bleeding.  Your breasts are red, tender, or warm.  You have a swollen area on either breast.  You have a fever or chills.  You have nausea or vomiting.  You have drainage other than breast milk from your nipples.  Your breasts do not become full before feedings by the fifth day after you give birth.  You feel sad and depressed.  Your baby is too sleepy to eat well.  Your baby is having trouble sleeping.   Your baby is wetting less than 3 diapers in a 24-hour period.  Your baby has less than 3 stools in a 24-hour period.  Your baby's skin or the white part of his or her eyes becomes yellow.   Your baby is not gaining weight by 57 days of age. SEEK IMMEDIATE MEDICAL CARE IF:   Your baby is overly tired (lethargic) and does not want to wake up and feed.  Your baby develops an unexplained fever. Document Released: 04/28/2005 Document Revised: 05/03/2013 Document Reviewed: 10/20/2012 [facility] Patient Information 2015 [place], [place]. This information is not intended to replace advice given to you by your health care provider. Make sure you discuss any questions you have with your health care provider. Third Trimester of Pregnancy The third trimester is from week 29 through week 42, months 7  through 9. This trimester is when your unborn baby (fetus) is growing very fast. At the end of  the ninth month, the unborn baby is about 20 inches in length. It weighs about 6-10 pounds.  HOME CARE   Avoid all smoking, herbs, and alcohol. Avoid drugs not approved by your doctor.  Only take medicine as told by your doctor. Some medicines are safe and some are not during pregnancy.  Exercise only as told by your doctor. Stop exercising if you start having cramps.  Eat regular, healthy meals.  Wear a good support bra if your breasts are tender.  Do not use hot tubs, steam rooms, or saunas.  Wear your seat belt when driving.  Avoid raw meat, uncooked cheese, and liter boxes and soil used by cats.  Take your prenatal vitamins.  Try taking medicine that helps you poop (stool softener) as needed, and if your doctor approves. Eat more fiber by eating fresh fruit, vegetables, and whole grains. Drink enough fluids to keep your pee (urine) clear or pale yellow.  Take warm water baths (sitz baths) to soothe pain or discomfort caused by hemorrhoids. Use hemorrhoid cream if your doctor approves.  If you have puffy, bulging veins (varicose veins), wear support hose. Raise (elevate) your feet for 15 minutes, 3-4 times a day. Limit salt in your diet.  Avoid heavy lifting, wear low heels, and sit up straight.  Rest with your legs raised if you have leg cramps or low back pain.  Visit your dentist if you have not gone during your pregnancy. Use a soft toothbrush to brush your teeth. Be gentle when you floss.  You can have sex (intercourse) unless your doctor tells you not to.  Do not travel far distances unless you must. Only do so with your doctor's approval.  Take prenatal classes.  Practice driving to the hospital.  Pack your hospital bag.  Prepare the baby's room.  Go to your doctor visits. GET HELP IF:  You are not sure if you are in labor or if your water has broken.  You are  dizzy.  You have mild cramps or pressure in your lower belly (abdominal).  You have a nagging pain in your belly area.  You continue to feel sick to your stomach (nauseous), throw up (vomit), or have watery poop (diarrhea).  You have bad smelling fluid coming from your vagina.  You have pain with peeing (urination). GET HELP RIGHT AWAY IF:   You have a fever.  You are leaking fluid from your vagina.  You are spotting or bleeding from your vagina.  You have severe belly cramping or pain.  You lose or gain weight rapidly.  You have trouble catching your breath and have chest pain.  You notice sudden or extreme puffiness (swelling) of your face, hands, ankles, feet, or legs.  You have not felt the baby move in over an hour.  You have severe headaches that do not go away with medicine.  You have vision changes. Document Released: 07/23/2009 Document Revised: 08/23/2012 Document Reviewed: 06/29/2012 [facility] Patient Information 2015 [place]. This information is not intended to replace advice given to you by your health care provider. Make sure you discuss any questions you have with your health care provider.

## 2013-11-23 NOTE — Progress Notes (Signed)
RX macrobid, no UTI syx (large leuks).  Urine culture sent. Reviewed preterm labor precautions.

## 2013-11-23 NOTE — Progress Notes (Signed)
Patient states that she is doing well. Denies N/V, edema, headache.   No vaginal bleeding, no vaginal discharge, +FM, no belly pain

## 2013-11-24 ENCOUNTER — Telehealth: Payer: Self-pay

## 2013-11-24 NOTE — Telephone Encounter (Signed)
Patient called stating she just has some questions regarding working, requests a call back. 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 Informed patient that she should give herself frequent breaks, elevate feet when she can and no heavy lifting over 20lbs while at work. Advised patient to listen to her body and if she is becoming tired then she should rest or take it easy but assured her that working is OK. Patient verbalized understanding and gratitude. No further questions or concerns.

## 2013-11-25 LAB — CULTURE, OB URINE: Colony Count: >=100000

## 2013-11-30 ENCOUNTER — Encounter: Payer: Self-pay | Admitting: Advanced Practice Midwife

## 2013-11-30 ENCOUNTER — Ambulatory Visit (INDEPENDENT_AMBULATORY_CARE_PROVIDER_SITE_OTHER): Payer: Medicaid Other | Admitting: Family Medicine

## 2013-11-30 VITALS — BP 124/81 | HR 97 | Temp 98.2°F | Wt 214.2 lb

## 2013-11-30 DIAGNOSIS — Z3480 Encounter for supervision of other normal pregnancy, unspecified trimester: Secondary | ICD-10-CM

## 2013-11-30 DIAGNOSIS — Z3493 Encounter for supervision of normal pregnancy, unspecified, third trimester: Secondary | ICD-10-CM

## 2013-11-30 LAB — POCT URINALYSIS DIP (DEVICE)
Bilirubin Urine: NEGATIVE
Glucose, UA: NEGATIVE mg/dL
Hgb urine dipstick: NEGATIVE
Ketones, ur: NEGATIVE mg/dL
Nitrite: NEGATIVE
Protein, ur: 30 mg/dL — AB
Specific Gravity, Urine: 1.02 (ref 1.005–1.030)
Urobilinogen, UA: 0.2 mg/dL (ref 0.0–1.0)
pH: 6 (ref 5.0–8.0)

## 2013-11-30 LAB — OB RESULTS CONSOLE GC/CHLAMYDIA: CHLAMYDIA, DNA PROBE: NEGATIVE

## 2013-11-30 LAB — OB RESULTS CONSOLE GBS: GBS: NEGATIVE

## 2013-11-30 NOTE — Patient Instructions (Signed)
Third Trimester of Pregnancy The third trimester is from week 29 through week 42, months 7 through 9. This trimester is when your unborn baby (fetus) is growing very fast. At the end of the ninth month, the unborn baby is about 20 inches in length. It weighs about 6-10 pounds.  HOME CARE   Avoid all smoking, herbs, and alcohol. Avoid drugs not approved by your doctor.  Only take medicine as told by your doctor. Some medicines are safe and some are not during pregnancy.  Exercise only as told by your doctor. Stop exercising if you start having cramps.  Eat regular, healthy meals.  Wear a good support bra if your breasts are tender.  Do not use hot tubs, steam rooms, or saunas.  Wear your seat belt when driving.  Avoid raw meat, uncooked cheese, and liter boxes and soil used by cats.  Take your prenatal vitamins.  Try taking medicine that helps you poop (stool softener) as needed, and if your doctor approves. Eat more fiber by eating fresh fruit, vegetables, and whole grains. Drink enough fluids to keep your pee (urine) clear or pale yellow.  Take warm water baths (sitz baths) to soothe pain or discomfort caused by hemorrhoids. Use hemorrhoid cream if your doctor approves.  If you have puffy, bulging veins (varicose veins), wear support hose. Raise (elevate) your feet for 15 minutes, 3-4 times a day. Limit salt in your diet.  Avoid heavy lifting, wear low heels, and sit up straight.  Rest with your legs raised if you have leg cramps or low back pain.  Visit your dentist if you have not gone during your pregnancy. Use a soft toothbrush to brush your teeth. Be gentle when you floss.  You can have sex (intercourse) unless your doctor tells you not to.  Do not travel far distances unless you must. Only do so with your doctor's approval.  Take prenatal classes.  Practice driving to the hospital.  Pack your hospital bag.  Prepare the baby's room.  Go to your doctor visits. GET  HELP IF:  You are not sure if you are in labor or if your water has broken.  You are dizzy.  You have mild cramps or pressure in your lower belly (abdominal).  You have a nagging pain in your belly area.  You continue to feel sick to your stomach (nauseous), throw up (vomit), or have watery poop (diarrhea).  You have bad smelling fluid coming from your vagina.  You have pain with peeing (urination). GET HELP RIGHT AWAY IF:   You have a fever.  You are leaking fluid from your vagina.  You are spotting or bleeding from your vagina.  You have severe belly cramping or pain.  You lose or gain weight rapidly.  You have trouble catching your breath and have chest pain.  You notice sudden or extreme puffiness (swelling) of your face, hands, ankles, feet, or legs.  You have not felt the baby move in over an hour.  You have severe headaches that do not go away with medicine.  You have vision changes. Document Released: 07/23/2009 Document Revised: 08/23/2012 Document Reviewed: 06/29/2012 [employer] Patient Information 2015 [employer]. This information is not intended to replace advice given to you by your health care provider. Make sure you discuss any questions you have with your health care provider.  

## 2013-11-30 NOTE — Progress Notes (Signed)
Patient is 28 y.o. at [id number] by Lauris Poag20w sono, +FM, no contractions, no VB, no LOF.  Labor warnings given. - GBS, repeat GC/CT swab today - considering breast feeding, undecided

## 2013-12-01 LAB — GC/CHLAMYDIA PROBE AMP
CT PROBE, AMP APTIMA: NEGATIVE
GC Probe RNA: NEGATIVE

## 2013-12-02 LAB — CULTURE, BETA STREP (GROUP B ONLY)

## 2013-12-03 ENCOUNTER — Encounter: Payer: Self-pay | Admitting: Advanced Practice Midwife

## 2013-12-13 ENCOUNTER — Ambulatory Visit (INDEPENDENT_AMBULATORY_CARE_PROVIDER_SITE_OTHER): Payer: Medicaid Other | Admitting: Family

## 2013-12-13 VITALS — BP 121/77 | HR 98 | Temp 97.5°F | Wt 212.7 lb

## 2013-12-13 DIAGNOSIS — Z3493 Encounter for supervision of normal pregnancy, unspecified, third trimester: Secondary | ICD-10-CM

## 2013-12-13 DIAGNOSIS — Z3480 Encounter for supervision of other normal pregnancy, unspecified trimester: Secondary | ICD-10-CM

## 2013-12-13 LAB — POCT URINALYSIS DIP (DEVICE)
BILIRUBIN URINE: NEGATIVE
GLUCOSE, UA: NEGATIVE mg/dL
Ketones, ur: NEGATIVE mg/dL
NITRITE: NEGATIVE
Protein, ur: NEGATIVE mg/dL
Specific Gravity, Urine: 1.015 (ref 1.005–1.030)
UROBILINOGEN UA: 0.2 mg/dL (ref 0.0–1.0)
pH: 6.5 (ref 5.0–8.0)

## 2013-12-13 NOTE — Progress Notes (Signed)
Pain- "cramping"    

## 2013-12-13 NOTE — Progress Notes (Signed)
Reviewed GBS results and discusses signs of labor.

## 2013-12-27 ENCOUNTER — Ambulatory Visit (INDEPENDENT_AMBULATORY_CARE_PROVIDER_SITE_OTHER): Payer: Medicaid Other | Admitting: Advanced Practice Midwife

## 2013-12-27 VITALS — BP 128/76 | HR 91 | Temp 97.5°F | Wt 220.1 lb

## 2013-12-27 DIAGNOSIS — Z3493 Encounter for supervision of normal pregnancy, unspecified, third trimester: Secondary | ICD-10-CM

## 2013-12-27 DIAGNOSIS — Z3480 Encounter for supervision of other normal pregnancy, unspecified trimester: Secondary | ICD-10-CM

## 2013-12-27 LAB — POCT URINALYSIS DIP (DEVICE)
BILIRUBIN URINE: NEGATIVE
Glucose, UA: NEGATIVE mg/dL
Hgb urine dipstick: NEGATIVE
KETONES UR: NEGATIVE mg/dL
NITRITE: NEGATIVE
Protein, ur: NEGATIVE mg/dL
Specific Gravity, Urine: 1.01 (ref 1.005–1.030)
Urobilinogen, UA: 0.2 mg/dL (ref 0.0–1.0)
pH: 7 (ref 5.0–8.0)

## 2013-12-27 NOTE — Patient Instructions (Signed)
Braxton Hicks Contractions Contractions of the uterus can occur throughout pregnancy. Contractions are not always a sign that you are in labor.  WHAT ARE BRAXTON HICKS CONTRACTIONS?  Contractions that occur before labor are called Braxton Hicks contractions, or false labor. Toward the end of pregnancy (32-34 weeks), these contractions can develop more often and may become more forceful. This is not true labor because these contractions do not result in opening (dilatation) and thinning of the cervix. They are sometimes difficult to tell apart from true labor because these contractions can be forceful and people have different pain tolerances. You should not feel embarrassed if you go to the hospital with false labor. Sometimes, the only way to tell if you are in true labor is for your health care provider to look for changes in the cervix. If there are no prenatal problems or other health problems associated with the pregnancy, it is completely safe to be sent home with false labor and await the onset of true labor. HOW CAN YOU TELL THE DIFFERENCE BETWEEN TRUE AND FALSE LABOR? False Labor  The contractions of false labor are usually shorter and not as hard as those of true labor.   The contractions are usually irregular.   The contractions are often felt in the front of the lower abdomen and in the groin.   The contractions may go away when you walk around or change positions while lying down.   The contractions get weaker and are shorter lasting as time goes on.   The contractions do not usually become progressively stronger, regular, and closer together as with true labor.  True Labor  Contractions in true labor last 30-70 seconds, become very regular, usually become more intense, and increase in frequency.   The contractions do not go away with walking.   The discomfort is usually felt in the top of the uterus and spreads to the lower abdomen and low back.   True labor can be  determined by your health care provider with an exam. This will show that the cervix is dilating and getting thinner.  WHAT TO REMEMBER  Keep up with your usual exercises and follow other instructions given by your health care provider.   Take medicines as directed by your health care provider.   Keep your regular prenatal appointments.   Eat and drink lightly if you think you are going into labor.   If Braxton Hicks contractions are making you uncomfortable:   Change your position from lying down or resting to walking, or from walking to resting.   Sit and rest in a tub of warm water.   Drink 2-3 glasses of water. Dehydration may cause these contractions.   Do slow and deep breathing several times an hour.  WHEN SHOULD I SEEK IMMEDIATE MEDICAL CARE? Seek immediate medical care if:  Your contractions become stronger, more regular, and closer together.   You have fluid leaking or gushing from your vagina.   You have a fever.   You pass blood-tinged mucus.   You have vaginal bleeding.   You have continuous abdominal pain.   You have low back pain that you never had before.   You feel your baby's head pushing down and causing pelvic pressure.   Your baby is not moving as much as it used to.  Document Released: 04/28/2005 Document Revised: 05/03/2013 Document Reviewed: 02/07/2013 ExitCare Patient Information 2015 [employer]. This information is not intended to replace advice given to you by your health care   provider. Make sure you discuss any questions you have with your health care provider.  Fetal Movement Counts Patient Name: __________________________________________________ Patient Due Date: ____________________ Performing a fetal movement count is highly recommended in high-risk pregnancies, but it is good for every pregnant woman to do. Your health care provider may ask you to start counting fetal movements at 28 weeks of the pregnancy. Fetal  movements often increase:  After eating a full meal.  After physical activity.  After eating or drinking something sweet or cold.  At rest. Pay attention to when you feel the baby is most active. This will help you notice a pattern of your baby's sleep and wake cycles and what factors contribute to an increase in fetal movement. It is important to perform a fetal movement count at the same time each day when your baby is normally most active.  HOW TO COUNT FETAL MOVEMENTS 1. Find a quiet and comfortable area to sit or lie down on your left side. Lying on your left side provides the best blood and oxygen circulation to your baby. 2. Write down the day and time on a sheet of paper or in a journal. 3. Start counting kicks, flutters, swishes, rolls, or jabs in a 2-hour period. You should feel at least 10 movements within 2 hours. 4. If you do not feel 10 movements in 2 hours, wait 2-3 hours and count again. Look for a change in the pattern or not enough counts in 2 hours. SEEK MEDICAL CARE IF:  You feel less than 10 counts in 2 hours, tried twice.  There is no movement in over an hour.  The pattern is changing or taking longer each day to reach 10 counts in 2 hours.  You feel the baby is not moving as he or she usually does. Date: ____________ Movements: ____________ Start time: ____________ Finish time: ____________  Date: ____________ Movements: ____________ Start time: ____________ Finish time: ____________ Date: ____________ Movements: ____________ Start time: ____________ Finish time: ____________ Date: ____________ Movements: ____________ Start time: ____________ Finish time: ____________ Date: ____________ Movements: ____________ Start time: ____________ Finish time: ____________ Date: ____________ Movements: ____________ Start time: ____________ Finish time: ____________ Date: ____________ Movements: ____________ Start time: ____________ Finish time: ____________ Date: ____________  Movements: ____________ Start time: ____________ Finish time: ____________  Date: ____________ Movements: ____________ Start time: ____________ Finish time: ____________ Date: ____________ Movements: ____________ Start time: ____________ Finish time: ____________ Date: ____________ Movements: ____________ Start time: ____________ Finish time: ____________ Date: ____________ Movements: ____________ Start time: ____________ Finish time: ____________ Date: ____________ Movements: ____________ Start time: ____________ Finish time: ____________ Date: ____________ Movements: ____________ Start time: ____________ Finish time: ____________ Date: ____________ Movements: ____________ Start time: ____________ Finish time: ____________  Date: ____________ Movements: ____________ Start time: ____________ Finish time: ____________ Date: ____________ Movements: ____________ Start time: ____________ Finish time: ____________ Date: ____________ Movements: ____________ Start time: ____________ Finish time: ____________ Date: ____________ Movements: ____________ Start time: ____________ Finish time: ____________ Date: ____________ Movements: ____________ Start time: ____________ Finish time: ____________ Date: ____________ Movements: ____________ Start time: ____________ Finish time: ____________ Date: ____________ Movements: ____________ Start time: ____________ Finish time: ____________  Date: ____________ Movements: ____________ Start time: ____________ Finish time: ____________ Date: ____________ Movements: ____________ Start time: ____________ Finish time: ____________ Date: ____________ Movements: ____________ Start time: ____________ Finish time: ____________ Date: ____________ Movements: ____________ Start time: ____________ Finish time: ____________ Date: ____________ Movements: ____________ Start time: ____________ Finish time: ____________ Date: ____________ Movements: ____________ Start time:  ____________ Finish time: ____________ Date: ____________ Movements:   ____________ Start time: ____________ Finish time: ____________  Date: ____________ Movements: ____________ Start time: ____________ Finish time: ____________ Date: ____________ Movements: ____________ Start time: ____________ Finish time: ____________ Date: ____________ Movements: ____________ Start time: ____________ Finish time: ____________ Date: ____________ Movements: ____________ Start time: ____________ Finish time: ____________ Date: ____________ Movements: ____________ Start time: ____________ Finish time: ____________ Date: ____________ Movements: ____________ Start time: ____________ Finish time: ____________ Date: ____________ Movements: ____________ Start time: ____________ Finish time: ____________  Date: ____________ Movements: ____________ Start time: ____________ Finish time: ____________ Date: ____________ Movements: ____________ Start time: ____________ Finish time: ____________ Date: ____________ Movements: ____________ Start time: ____________ Finish time: ____________ Date: ____________ Movements: ____________ Start time: ____________ Finish time: ____________ Date: ____________ Movements: ____________ Start time: ____________ Finish time: ____________ Date: ____________ Movements: ____________ Start time: ____________ Finish time: ____________ Date: ____________ Movements: ____________ Start time: ____________ Finish time: ____________  Date: ____________ Movements: ____________ Start time: ____________ Finish time: ____________ Date: ____________ Movements: ____________ Start time: ____________ Finish time: ____________ Date: ____________ Movements: ____________ Start time: ____________ Finish time: ____________ Date: ____________ Movements: ____________ Start time: ____________ Finish time: ____________ Date: ____________ Movements: ____________ Start time: ____________ Finish time: ____________ Date:  ____________ Movements: ____________ Start time: ____________ Finish time: ____________ Date: ____________ Movements: ____________ Start time: ____________ Finish time: ____________  Date: ____________ Movements: ____________ Start time: ____________ Finish time: ____________ Date: ____________ Movements: ____________ Start time: ____________ Finish time: ____________ Date: ____________ Movements: ____________ Start time: ____________ Finish time: ____________ Date: ____________ Movements: ____________ Start time: ____________ Finish time: ____________ Date: ____________ Movements: ____________ Start time: ____________ Finish time: ____________ Date: ____________ Movements: ____________ Start time: ____________ Finish time: ____________ Document Released: 05/28/2006 Document Revised: 09/12/2013 Document Reviewed: 02/23/2012 ExitCare Patient Information 2015 [employer]. This information is not intended to replace advice given to you by your health care provider. Make sure you discuss any questions you have with your health care provider.  

## 2013-12-27 NOTE — Progress Notes (Signed)
Pt doesn't know pre-preg weight. Offered growth US offered, declined.  Discussed IOL, PD testing.

## 2013-12-30 ENCOUNTER — Ambulatory Visit (INDEPENDENT_AMBULATORY_CARE_PROVIDER_SITE_OTHER): Payer: Medicaid Other | Admitting: *Deleted

## 2013-12-30 VITALS — BP 118/72 | HR 96

## 2013-12-30 DIAGNOSIS — O480 Post-term pregnancy: Secondary | ICD-10-CM

## 2013-12-30 LAB — FETAL NONSTRESS TEST

## 2013-12-30 NOTE — Progress Notes (Signed)
NST/AFI

## 2013-12-31 NOTE — Progress Notes (Signed)
NST performed today was reviewed and was found to be reactive.  AFI normal at 15.7 cm. Continue recommended antenatal testing and prenatal care.  

## 2014-01-02 ENCOUNTER — Inpatient Hospital Stay (HOSPITAL_COMMUNITY): Payer: Medicaid Other | Admitting: Anesthesiology

## 2014-01-02 ENCOUNTER — Encounter (HOSPITAL_COMMUNITY): Payer: Self-pay | Admitting: *Deleted

## 2014-01-02 ENCOUNTER — Inpatient Hospital Stay (HOSPITAL_COMMUNITY)
Admission: AD | Admit: 2014-01-02 | Discharge: 2014-01-04 | DRG: 775 | Disposition: A | Discharge status: Home / Self Care | Payer: Medicaid Other | Source: Ambulatory Visit | Attending: Obstetrics and Gynecology | Admitting: Obstetrics and Gynecology

## 2014-01-02 ENCOUNTER — Encounter (HOSPITAL_COMMUNITY): Payer: Medicaid Other | Admitting: Anesthesiology

## 2014-01-02 DIAGNOSIS — O479 False labor, unspecified: Secondary | ICD-10-CM | POA: Diagnosis present

## 2014-01-02 DIAGNOSIS — Z87891 Personal history of nicotine dependence: Secondary | ICD-10-CM

## 2014-01-02 DIAGNOSIS — Z833 Family history of diabetes mellitus: Secondary | ICD-10-CM

## 2014-01-02 DIAGNOSIS — Z8249 Family history of ischemic heart disease and other diseases of the circulatory system: Secondary | ICD-10-CM

## 2014-01-02 DIAGNOSIS — IMO0001 Reserved for inherently not codable concepts without codable children: Secondary | ICD-10-CM

## 2014-01-02 HISTORY — DX: Other specified health status: Z78.9

## 2014-01-02 LAB — POCT FERN TEST: POCT FERN TEST: NEGATIVE

## 2014-01-02 LAB — CBC
HEMATOCRIT: 39.5 % (ref 36.0–46.0)
Hemoglobin: 13.4 g/dL (ref 12.0–15.0)
MCH: 28.4 pg (ref 26.0–34.0)
MCHC: 33.9 g/dL (ref 30.0–36.0)
MCV: 83.7 fL (ref 78.0–100.0)
Platelets: 145 10*3/uL — ABNORMAL LOW (ref 150–400)
RBC: 4.72 MIL/uL (ref 3.87–5.11)
RDW: 14.7 % (ref 11.5–15.5)
WBC: 8.8 10*3/uL (ref 4.0–10.5)

## 2014-01-02 LAB — AMNISURE RUPTURE OF MEMBRANE (ROM) NOT AT ARMC: Amnisure ROM: POSITIVE

## 2014-01-02 LAB — TYPE AND SCREEN
ABO/RH(D): O POS
Antibody Screen: NEGATIVE

## 2014-01-02 LAB — RPR

## 2014-01-02 LAB — ABO/RH: ABO/RH(D): O POS

## 2014-01-02 MED ORDER — LIDOCAINE HCL (PF) 1 % IJ SOLN
INTRAMUSCULAR | Status: DC | PRN
  Administered 2014-01-02: 10 mL

## 2014-01-02 MED ORDER — EPHEDRINE 5 MG/ML INJ
10.0000 mg | INTRAVENOUS | Status: DC | PRN
  Filled 2014-01-02: qty 2

## 2014-01-02 MED ORDER — LIDOCAINE HCL (PF) 1 % IJ SOLN
30.0000 mL | INTRAMUSCULAR | Status: DC | PRN
  Filled 2014-01-02: qty 30

## 2014-01-02 MED ORDER — FENTANYL 2.5 MCG/ML BUPIVACAINE 1/10 % EPIDURAL INFUSION (WH - ANES)
14.0000 mL/h | INTRAMUSCULAR | Status: DC | PRN
  Administered 2014-01-02 (×2): 14 mL/h via EPIDURAL
  Filled 2014-01-02 (×3): qty 125

## 2014-01-02 MED ORDER — FLEET ENEMA 7-19 GM/118ML RE ENEM: 1.0000 | ENEMA | RECTAL | Status: DC | PRN

## 2014-01-02 MED ORDER — FENTANYL CITRATE 0.05 MG/ML IJ SOLN
INTRAMUSCULAR | Status: AC
  Filled 2014-01-02: qty 2

## 2014-01-02 MED ORDER — LACTATED RINGERS IV SOLN
500.0000 mL | Freq: Once | INTRAVENOUS | Status: AC
  Administered 2014-01-02: 500 mL via INTRAVENOUS

## 2014-01-02 MED ORDER — PHENYLEPHRINE 40 MCG/ML (10ML) SYRINGE FOR IV PUSH (FOR BLOOD PRESSURE SUPPORT)
80.0000 ug | PREFILLED_SYRINGE | INTRAVENOUS | Status: DC | PRN
  Filled 2014-01-02: qty 2

## 2014-01-02 MED ORDER — ACETAMINOPHEN 325 MG PO TABS: 650.0000 mg | ORAL_TABLET | ORAL | Status: DC | PRN

## 2014-01-02 MED ORDER — OXYTOCIN BOLUS FROM INFUSION
500.0000 mL | INTRAVENOUS | Status: DC
  Administered 2014-01-03: 500 mL via INTRAVENOUS

## 2014-01-02 MED ORDER — FENTANYL CITRATE 0.05 MG/ML IJ SOLN
50.0000 ug | Freq: Once | INTRAMUSCULAR | Status: DC
  Filled 2014-01-02: qty 2

## 2014-01-02 MED ORDER — DIPHENHYDRAMINE HCL 50 MG/ML IJ SOLN: 12.5000 mg | INTRAMUSCULAR | Status: DC | PRN

## 2014-01-02 MED ORDER — IBUPROFEN 600 MG PO TABS
600.0000 mg | ORAL_TABLET | Freq: Four times a day (QID) | ORAL | Status: DC | PRN
Start: 2014-01-02 — End: 2014-01-03

## 2014-01-02 MED ORDER — ONDANSETRON HCL 4 MG/2ML IJ SOLN
4.0000 mg | Freq: Four times a day (QID) | INTRAMUSCULAR | Status: DC | PRN
  Administered 2014-01-02: 4 mg via INTRAVENOUS
  Filled 2014-01-02: qty 2

## 2014-01-02 MED ORDER — CITRIC ACID-SODIUM CITRATE 334-500 MG/5ML PO SOLN: 30.0000 mL | ORAL | Status: DC | PRN

## 2014-01-02 MED ORDER — LACTATED RINGERS IV SOLN
INTRAVENOUS | Status: DC
  Administered 2014-01-02 (×2): via INTRAVENOUS

## 2014-01-02 MED ORDER — PHENYLEPHRINE 40 MCG/ML (10ML) SYRINGE FOR IV PUSH (FOR BLOOD PRESSURE SUPPORT)
80.0000 ug | PREFILLED_SYRINGE | INTRAVENOUS | Status: DC | PRN
  Filled 2014-01-02: qty 10
  Filled 2014-01-02: qty 2

## 2014-01-02 MED ORDER — OXYCODONE-ACETAMINOPHEN 5-325 MG PO TABS: 1.0000 | ORAL_TABLET | ORAL | Status: DC | PRN

## 2014-01-02 MED ORDER — FENTANYL 2.5 MCG/ML BUPIVACAINE 1/10 % EPIDURAL INFUSION (WH - ANES): 14.0000 mL/h | INTRAMUSCULAR | Status: DC | PRN

## 2014-01-02 MED ORDER — LACTATED RINGERS IV SOLN: 500.0000 mL | INTRAVENOUS | Status: DC | PRN

## 2014-01-02 MED ORDER — OXYTOCIN 40 UNITS IN LACTATED RINGERS INFUSION - SIMPLE MED
62.5000 mL/h | INTRAVENOUS | Status: DC
  Filled 2014-01-02: qty 1000

## 2014-01-02 MED ORDER — FENTANYL CITRATE 0.05 MG/ML IJ SOLN
100.0000 ug | Freq: Once | INTRAMUSCULAR | Status: AC
  Administered 2014-01-02: 100 ug via INTRAVENOUS

## 2014-01-02 NOTE — MAU Note (Signed)
Assumed care of patient.

## 2014-01-02 NOTE — Anesthesia Preprocedure Evaluation (Signed)
Anesthesia Evaluation  Patient identified by MRN, date of birth, ID band Patient awake    Reviewed: Allergy & Precautions, H&P , Patient's Chart, lab work & pertinent test results  Airway Mallampati: II  TM Distance: >3 FB Neck ROM: full    Dental  (+) Teeth Intact   Pulmonary former smoker,    breath sounds clear to auscultation       Cardiovascular  Rhythm:regular Rate:Normal     Neuro/Psych    GI/Hepatic   Endo/Other    Renal/GU      Musculoskeletal   Abdominal   Peds  Hematology   Anesthesia Other Findings       Reproductive/Obstetrics (+) Pregnancy                             Anesthesia Physical Anesthesia Plan  ASA: III  Anesthesia Plan: Epidural   Post-op Pain Management:    Induction:   Airway Management Planned:   Additional Equipment:   Intra-op Plan:   Post-operative Plan:   Informed Consent: I have reviewed the patients History and Physical, chart, labs and discussed the procedure including the risks, benefits and alternatives for the proposed anesthesia with the patient or authorized representative who has indicated his/her understanding and acceptance.   Dental Advisory Given  Plan Discussed with:   Anesthesia Plan Comments: (Labs checked- platelets confirmed with RN in room. Fetal heart tracing, per RN, reported to be stable enough for sitting procedure. Discussed epidural, and patient consents to the procedure:  included risk of possible headache,backache, failed block, allergic reaction, and nerve injury. This patient was asked if she had any questions or concerns before the procedure started.)        Anesthesia Quick Evaluation  

## 2014-01-02 NOTE — MAU Note (Signed)
Urine in lab 

## 2014-01-02 NOTE — MAU Note (Signed)
Pt states here for labor eval. Has been contracting 5 minutes apart. Denies gush of fluid.

## 2014-01-02 NOTE — Progress Notes (Signed)
Patient reported to nurse that she is unable to afford a crib/pack and play for her baby to sleep in after discharge from hospital. Patient currently lives with her mother. Nurse had an extensive conversation with patient about possible other needs after delivery, patient states she cannot think of anything else at this time. Dr. Ane Payment notified, order placed for social work consult, social work notified and voicemail left with Child psychotherapist. Day shift nurse will report situation to night shift nurse. Will continue to monitor.

## 2014-01-02 NOTE — H&P (Signed)
LABOR ADMISSION HISTORY AND PHYSICAL  Meagan Stephens is a 28 y.o. female G3P0020 with IUP at [redacted]w[redacted]d by 20 week ultrasound presenting for early labor/SROM. Pt presents w/ contractions starting this morning, occuring every 2-4 minutes, increasing in intensity. ROM around 1000. She reports +FMs, No LOF. She desires an epidural for labor pain control. She plans on breast and bottle feeding. She request OCPs/depo/Mirena for birth control.   Dating: By 20 week ultrasound --->  Estimated Date of Delivery: 12/28/13  Prenatal History/Complications:  Past Medical History: Past Medical History  Diagnosis Date  . Medical history non-contributory     Past Surgical History: Past Surgical History  Procedure Laterality Date  . Wisdom tooth extraction      Obstetrical History: OB History   Grav Para Term Preterm Abortions TAB SAB Ect Mult Living   0         Gynecological History: OB History   Grav Para Term Preterm Abortions TAB SAB Ect Mult Living   0         Social History: History   Social History  . Marital Status: Single    Spouse Name: N/A    Number of Children: N/A  . Years of Education: N/A   Social History Main Topics  . Smoking status: Former Smoker -- 0.25 packs/day for 5 years    Types: Cigarettes  . Smokeless tobacco: Never Used  . Alcohol Use: Yes  . Drug Use: Yes    Special: Marijuana  . Sexual Activity: Yes    Birth Control/ Protection: None   Other Topics Concern  . None   Social History Narrative  . None    Family History: Family History  Problem Relation Age of Onset  . Hypertension Mother   . Diabetes Maternal Grandmother   . Hypertension Maternal Grandmother     Allergies: No Known Allergies  Prescriptions prior to admission  Medication Sig Dispense Refill  . Prenatal Vit-Fe Fumarate-FA (PRENATAL VITAMINS) 28-0.8 MG TABS Take 1 tablet by mouth daily.  30 tablet  10     Review of Systems   All systems reviewed and  negative except as stated in HPI  Blood pressure 124/75, pulse 100, temperature 98.4 F (36.9 C), temperature source Oral, resp. rate 18, height  (1.626 m), weight 98.998 kg (218 lb 4 oz), last menstrual period 04/07/2013. General appearance: alert and no distress Abdomen: soft, non-tender; bowel sounds normal Pelvic: 3-4/50/-3 in MAU Extremities: Homans sign is negative, no sign of DVT Presentation: cephalic Fetal monitoringBaseline: 135 bpm, Variability: Good {> 6 bpm), Accelerations: Reactive and Decelerations: Absent Uterine activityregular every 2-3 minutes  Dilation: 3.5 Effacement (%): 50 Station: -2 Exam by:: Dellie Burns, RN BSN   Prenatal labs: ABO, Rh: O/POS/-- (03/31 1347) Antibody: NEG (03/31 1347) Rubella:   RPR: NON REAC (06/12 1228)  HBsAg: NEGATIVE (03/31 1347)  HIV: NONREACTIVE (06/12 1228)  GBS:    1 hr Glucola 125 Genetic screening  Not done Anatomy US Normal   Prenatal Transfer Tool  Maternal Diabetes: No Genetic Screening: Declined Maternal Ultrasounds/Referrals: Normal Fetal Ultrasounds or other Referrals:  None Maternal Substance Abuse:  No Significant Maternal Medications:  None Significant Maternal Lab Results: None     Results for orders placed during the hospital encounter of 01/02/14 (from the past 24 hour(s))  AMNISURE RUPTURE OF MEMBRANE (ROM)   Collection Time    01/02/14 12:20 PM  Result Value Ref Range   Amnisure ROM POSITIVE    POCT FERN TEST   Collection Time    01/02/14 12:25 PM      Result Value Ref Range   POCT Fern Test Negative = intact amniotic membranes      Patient Active Problem List   Diagnosis Date Noted  . Active labor at term 01/02/2014  . Chlamydia infection complicating pregnancy in second trimester 08/10/2013  . Flu vaccine need 08/09/2013  . Supervision of normal pregnancy 08/09/2013    Assessment: Meagan Stephens is a 45 y.o. G3P0020 at [redacted]w[redacted]d here for early labor/SROM.    #Labor:  Expectant management. If no cervical change, will plan to start pitocin. Anticipate SVD.  #Pain: Epidural #FWB: Category 1 #ID:  GBS negative #MOF: Breast/Bottle #MOC:Still deciding.  #Circ:  Female  William Dalton 01/02/2014, 1:54 PM

## 2014-01-02 NOTE — Anesthesia Procedure Notes (Signed)

## 2014-01-03 ENCOUNTER — Encounter (HOSPITAL_COMMUNITY): Payer: Self-pay | Admitting: *Deleted

## 2014-01-03 ENCOUNTER — Other Ambulatory Visit: Payer: Medicaid Other

## 2014-01-03 DIAGNOSIS — Z87891 Personal history of nicotine dependence: Secondary | ICD-10-CM

## 2014-01-03 DIAGNOSIS — Z8249 Family history of ischemic heart disease and other diseases of the circulatory system: Secondary | ICD-10-CM

## 2014-01-03 DIAGNOSIS — Z833 Family history of diabetes mellitus: Secondary | ICD-10-CM

## 2014-01-03 LAB — CBC
HCT: 34 % — ABNORMAL LOW (ref 36.0–46.0)
Hemoglobin: 11.5 g/dL — ABNORMAL LOW (ref 12.0–15.0)
MCH: 28.5 pg (ref 26.0–34.0)
MCHC: 33.8 g/dL (ref 30.0–36.0)
MCV: 84.4 fL (ref 78.0–100.0)
Platelets: 142 10*3/uL — ABNORMAL LOW (ref 150–400)
RBC: 4.03 MIL/uL (ref 3.87–5.11)
RDW: 14.7 % (ref 11.5–15.5)
WBC: 13.4 10*3/uL — ABNORMAL HIGH (ref 4.0–10.5)

## 2014-01-03 MED ORDER — ONDANSETRON HCL 4 MG PO TABS: 4.0000 mg | ORAL_TABLET | ORAL | Status: DC | PRN

## 2014-01-03 MED ORDER — BENZOCAINE-MENTHOL 20-0.5 % EX AERO
1.0000 "application " | INHALATION_SPRAY | CUTANEOUS | Status: DC | PRN
  Administered 2014-01-03: 1 via TOPICAL
  Filled 2014-01-03: qty 56

## 2014-01-03 MED ORDER — DIPHENHYDRAMINE HCL 25 MG PO CAPS: 25.0000 mg | ORAL_CAPSULE | Freq: Four times a day (QID) | ORAL | Status: DC | PRN

## 2014-01-03 MED ORDER — TETANUS-DIPHTH-ACELL PERTUSSIS 5-2.5-18.5 LF-MCG/0.5 IM SUSP: 0.5000 mL | Freq: Once | INTRAMUSCULAR | Status: DC

## 2014-01-03 MED ORDER — OXYCODONE-ACETAMINOPHEN 5-325 MG PO TABS: 1.0000 | ORAL_TABLET | ORAL | Status: DC | PRN

## 2014-01-03 MED ORDER — PRENATAL MULTIVITAMIN CH
1.0000 | ORAL_TABLET | Freq: Every day | ORAL | Status: DC
  Administered 2014-01-03 – 2014-01-04 (×2): 1 via ORAL
  Filled 2014-01-03 (×2): qty 1

## 2014-01-03 MED ORDER — ONDANSETRON HCL 4 MG/2ML IJ SOLN: 4.0000 mg | INTRAMUSCULAR | Status: DC | PRN

## 2014-01-03 MED ORDER — SENNOSIDES-DOCUSATE SODIUM 8.6-50 MG PO TABS
2.0000 | ORAL_TABLET | ORAL | Status: DC
  Administered 2014-01-03: 2 via ORAL
  Filled 2014-01-03: qty 2

## 2014-01-03 MED ORDER — IBUPROFEN 600 MG PO TABS
600.0000 mg | ORAL_TABLET | Freq: Four times a day (QID) | ORAL | Status: DC
  Administered 2014-01-03 – 2014-01-04 (×6): 600 mg via ORAL
  Filled 2014-01-03 (×6): qty 1

## 2014-01-03 MED ORDER — ZOLPIDEM TARTRATE 5 MG PO TABS: 5.0000 mg | ORAL_TABLET | Freq: Every evening | ORAL | Status: DC | PRN

## 2014-01-03 MED ORDER — DIBUCAINE 1 % RE OINT: 1.0000 "application " | TOPICAL_OINTMENT | RECTAL | Status: DC | PRN

## 2014-01-03 MED ORDER — LANOLIN HYDROUS EX OINT: TOPICAL_OINTMENT | CUTANEOUS | Status: DC | PRN

## 2014-01-03 MED ORDER — SIMETHICONE 80 MG PO CHEW: 80.0000 mg | CHEWABLE_TABLET | ORAL | Status: DC | PRN

## 2014-01-03 MED ORDER — WITCH HAZEL-GLYCERIN EX PADS: 1.0000 "application " | MEDICATED_PAD | CUTANEOUS | Status: DC | PRN

## 2014-01-03 NOTE — Anesthesia Postprocedure Evaluation (Signed)
Anesthesia Post Note  Patient: Meagan Stephens  Procedure(s) Performed: * No procedures listed *  Anesthesia type: Epidural  Patient location: Mother/Baby  Post pain: Pain level controlled  Post assessment: Post-op Vital signs reviewed  Last Vitals:  Filed Vitals:   01/03/14 0500  BP: 125/64  Pulse: 65  Temp: 36.8 C  Resp: 18    Post vital signs: Reviewed  Level of consciousness:alert  Complications: No apparent anesthesia complications

## 2014-01-03 NOTE — Progress Notes (Signed)
Clinical Social Work Department PSYCHOSOCIAL ASSESSMENT - MATERNAL/CHILD 01/03/2014  Patient:  Meagan Stephens, Meagan Stephens  Account Number:  [id number]  Admit Date:  01/02/2014  Marjo Bicker Name:   Meagan Stephens    Clinical Social Worker:  Loleta Books, CLINICAL SOCIAL WORKER   Date/Time:  01/03/2014 12:15 PM  Date Referred:  01/03/2014   Referral source  RN     Referred reason  Substance Abuse  Other - See comment   Other referral source:    I:  FAMILY / HOME ENVIRONMENT Child's legal guardian:  PARENT  Guardian - Name Guardian - Age Guardian - Address  Meagan Stephens [address], [place], [place] [address]   Other household support members/support persons Name Relationship DOB   MOTHER    STEPFATHER    Other support:   MOB shared that she has friends who are supportive, also expressed that her mother is assisting her as she transitions to becoming a first time mother.    II  PSYCHOSOCIAL DATA Information Source:  Patient Interview  Event organiser Employment:   MOB shared that she is employed.   Financial resources:  Medicaid If Medicaid - County:  [place] Other  Sales executive  WIC   School / Grade:  N/A Government social research officer / Child Services Coordination / Early Interventions:   N/A  Cultural issues impacting care:   None reported    III  STRENGTHS Strengths  Supportive family/friends  Adequate Resources   Strength comment:    IV  RISK FACTORS AND CURRENT PROBLEMS Current Problem:  YES   Risk Factor & Current Problem Patient Issue Family Issue Risk Factor / Current Problem Comment  Basic Needs (food,housing,etc) N N MOB informed nurse that she needed assistance securing a crib.  Upon further exploration, MOB acknowledged that she had the money to purchase a basinet, but was only wondering if hospital had access to free/cheaper option.  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  MOB denied use during pregnancy and denied previous  history of THC use.    V  SOCIAL WORK ASSESSMENT CSW completed assessment with MOB.  MOB receptive to completing the assessment and presented as easily engaged in the assessment.  Mood and affect were observed to be appropriate during the assessment. She willingly processed her thoughts and feelings when she first learned that she was pregnant since she did not learn that she was pregnant until she was 4-5 months into her pregnancy.  She discussed how she became excited during her pregnancy despite the FOB being minimally involved.  She shared that they plan on completing paternity testing once the baby is discharged from the hospital.  CSW inquired about thoughts and feelings related to paternity testing and the potential of being a single mother, but MOB shared that she wanted the paternity testing and minimized any stressors associated with becoming a first time mother.  MOB shared that her mother is very supportive and will assist her with childcare.  She also mentioned that she will be moving in upcoming weeks to her own home since she has been living with her mother and stepfather in recent months.  She shared that once she lives on her own, she will be able to utilize family support when needed to help her with the baby.   MOB shared that she has all basic baby items except for a crib.  She acknowledged that she has shopped minimally for a crib, but disclosed that she did find a  basinet at [facility] that was affordable.  CSW inquired about her request for basinet/crib, and MOB shared that she was only wondering if the hospital had any that would have been free or at a reduced price.  CSW discussed that the hospital does not have anything available at the hospital, but would be able to provide a list of resources where she may find one available.  MOB denied need for this list, and confirmed that she can afford the one that she found at [facility].   CSW inquired about substance use.  MOB denied substance  use, and shared that she never used THC during her pregnancy.  MOB also denied any previous use of THC, including experimentation in a social setting.  CSW discussed drug screen policy due to report of THC use during pregnancy. MOB presented as anxious and had numerous questions related to drug screen policy and what would occur if meconium was positive despite her reports that she did not smoke while pregnant.  She inquired about possibility of her baby being positive if she was spending time with friends who were smoking.  CSW discussed that if meconium is positive, it would be due to her use.  MOB is providing mixed reports of THC use during pregnancy upon review of medical records.      VI SOCIAL WORK PLAN Social Work Plan  Information/Referral to [employer]  No Further Intervention Required / No Barriers to Discharge  Patient/Family Education   Type of pt/family education:   Postpartum depression and anxiety  Meconium drug screen policy   If child protective services report - county:   If child protective services report - date:   Information/referral to community resources comment:   CSW provided MOB with list of pediatricians as MOB did not have pediatrician chosen upon admission.    Other social work plan:   Ongoing emotional support as needed. CSW to continue to monitor meconium drug screen and will CPS report if positive.

## 2014-01-04 ENCOUNTER — Encounter: Payer: Medicaid Other | Admitting: Obstetrics and Gynecology

## 2014-01-04 MED ORDER — IBUPROFEN 600 MG PO TABS: 600.0000 mg | ORAL_TABLET | Freq: Four times a day (QID) | ORAL | Status: DC

## 2014-01-04 NOTE — Discharge Summary (Signed)
Obstetric Discharge Summary Reason for Admission: onset of labor Prenatal Procedures: none Intrapartum Procedures: spontaneous vaginal delivery Postpartum Procedures: none Complications-Operative and Postpartum: 2nd degree perineal laceration Hemoglobin  Date Value Ref Range Status  01/03/2014 11.5* 12.0 - 15.0 g/dL Final     HCT  Date Value Ref Range Status  01/03/2014 34.0* 36.0 - 46.0 % Final    Discharge Diagnoses: Term Pregnancy-delivered  Hospital Course:  Meagan Stephens is a 28 y.o. [id number] who presented with SOL.  She had a uncomplicated SVD with 2nd degree perineal laceration that was repaired w/o complication. She was able to ambulate, tolerate PO and void normally. She was discharged home with instructions for postpartum care.    Delivery Note  At 12:58 AM a viable female was delivered via Vaginal, Spontaneous Delivery (Presentation: ; Occiput Anterior). APGAR: 8, 9; weight 8 lb 5.2 oz (3775 g).  Placenta status: Intact, Spontaneous. Cord: 3 vessels with the following complications: None.  Anesthesia: Epidural  Episiotomy: None  Lacerations: 2nd degree;Perineal  Suture Repair: 3.0 vicryl  Est. Blood Loss (mL): 250  Mom to postpartum. Baby to Couplet care / Skin to Skin.  William Dalton  01/03/2014, 3:28 AM  Physical Exam:  General: alert, cooperative, appears stated age and no distress Lochia: appropriate Uterine Fundus: firm DVT Evaluation: No evidence of DVT seen on physical exam. Negative Homan's sign. No cords or calf tenderness. No significant calf/ankle edema.  Discharge Information: Date: 01/04/2014 Activity: pelvic rest Diet: routine Medications: PNV and Ibuprofen Baby feeding: plans to breastfeed, plans to bottle feed Contraception: undecided Condition: stable Instructions: refer to practice specific booklet Discharge to: home   Newborn Data: Live born female  Birth Weight: 8 lb 5.2 oz (3775 g) APGAR: 8, 9  Home with mother.  Wenda Low,  MD [facility] PGY-1 01/04/2014, 9:22 AM  OB fellow Post Partum Day  I have seen and examined this patient and agree with above documentation in the resident's note.  Meagan Stephens is a 51 y.o. [id number] s/p SVD.  Pt denies problems with ambulating, voiding or po intake. Pain is well controlled  PE:  BP 126/73  Pulse 66  Temp(Src) 98 F (36.7 C) (Oral)  Resp 18  Ht  (1.626 m)  Wt 218 lb 4 oz (98.998 kg)  BMI 37.44 kg/m2  SpO2 98%  LMP 04/07/2013  Breastfeeding? Unknown Fundus firm Plan for discharge: today  Perry Mount, MD 3:40 AM

## 2014-01-04 NOTE — Discharge Instructions (Signed)
Postpartum Care After Vaginal Delivery °After you deliver your newborn (postpartum period), the usual stay in the hospital is 24-72 hours. If there were problems with your labor or delivery, or if you have other medical problems, you might be in the hospital longer.  °While you are in the hospital, you will receive help and instructions on how to care for yourself and your newborn during the postpartum period.  °While you are in the hospital: °· Be sure to tell your nurses if you have pain or discomfort, as well as where you feel the pain and what makes the pain worse. °· If you had an incision made near your vagina (episiotomy) or if you had some tearing during delivery, the nurses may put ice packs on your episiotomy or tear. The ice packs may help to reduce the pain and swelling. °· If you are breastfeeding, you may feel uncomfortable contractions of your uterus for a couple of weeks. This is normal. The contractions help your uterus get back to normal size. °· It is normal to have some bleeding after delivery. °· For the first 1-3 days after delivery, the flow is red and the amount may be similar to a period. °· It is common for the flow to start and stop. °· In the first few days, you may pass some small clots. Let your nurses know if you begin to pass large clots or your flow increases. °· Do not  flush blood clots down the toilet before having the nurse look at them. °· During the next 3-10 days after delivery, your flow should become more watery and pink or brown-tinged in color. °· Ten to fourteen days after delivery, your flow should be a small amount of yellowish-white discharge. °· The amount of your flow will decrease over the first few weeks after delivery. Your flow may stop in 6-8 weeks. Most women have had their flow stop by 12 weeks after delivery. °· You should change your sanitary pads frequently. °· Wash your hands thoroughly with soap and water for at least 20 seconds after changing pads, using  the toilet, or before holding or feeding your newborn. °· You should feel like you need to empty your bladder within the first 6-8 hours after delivery. °· In case you become weak, lightheaded, or faint, call your nurse before you get out of bed for the first time and before you take a shower for the first time. °· Within the first few days after delivery, your breasts may begin to feel tender and full. This is called engorgement. Breast tenderness usually goes away within 48-72 hours after engorgement occurs. You may also notice milk leaking from your breasts. If you are not breastfeeding, do not stimulate your breasts. Breast stimulation can make your breasts produce more milk. °· Spending as much time as possible with your newborn is very important. During this time, you and your newborn can feel close and get to know each other. Having your newborn stay in your room (rooming in) will help to strengthen the bond with your newborn.  It will give you time to get to know your newborn and become comfortable caring for your newborn. °· Your hormones change after delivery. Sometimes the hormone changes can temporarily cause you to feel sad or tearful. These feelings should not last more than a few days. If these feelings last longer than that, you should talk to your caregiver. °· If desired, talk to your caregiver about methods of family planning or contraception. °·   Talk to your caregiver about immunizations. Your caregiver may want you to have the following immunizations before leaving the hospital: °· Tetanus, diphtheria, and pertussis (Tdap) or tetanus and diphtheria (Td) immunization. It is very important that you and your family (including grandparents) or others caring for your newborn are up-to-date with the Tdap or Td immunizations. The Tdap or Td immunization can help protect your newborn from getting ill. °· Rubella immunization. °· Varicella (chickenpox) immunization. °· Influenza immunization. You should  receive this annual immunization if you did not receive the immunization during your pregnancy. °Document Released: 02/23/2007 Document Revised: 01/21/2012 Document Reviewed: 12/24/2011 °ExitCare® Patient Information ©2015 [employer]. This information is not intended to replace advice given to you by your health care provider. Make sure you discuss any questions you have with your health care provider. ° °Contraception Choices °Contraception (birth control) is the use of any methods or devices to prevent pregnancy. Below are some methods to help avoid pregnancy. °HORMONAL METHODS  °· Contraceptive implant. This is a thin, plastic tube containing progesterone hormone. It does not contain estrogen hormone. Your health care provider inserts the tube in the inner part of the upper arm. The tube can remain in place for up to 3 years. After 3 years, the implant must be removed. The implant prevents the ovaries from releasing an egg (ovulation), thickens the cervical mucus to prevent sperm from entering the uterus, and thins the lining of the inside of the uterus. °· Progesterone-only injections. These injections are given every 3 months by your health care provider to prevent pregnancy. This synthetic progesterone hormone stops the ovaries from releasing eggs. It also thickens cervical mucus and changes the uterine lining. This makes it harder for sperm to survive in the uterus. °· Birth control pills. These pills contain estrogen and progesterone hormone. They work by preventing the ovaries from releasing eggs (ovulation). They also cause the cervical mucus to thicken, preventing the sperm from entering the uterus. Birth control pills are prescribed by a health care provider. Birth control pills can also be used to treat heavy periods. °· Minipill. This type of birth control pill contains only the progesterone hormone. They are taken every day of each month and must be prescribed by your health care provider. °· Birth  control patch. The patch contains hormones similar to those in birth control pills. It must be changed once a week and is prescribed by a health care provider. °· Vaginal ring. The ring contains hormones similar to those in birth control pills. It is left in the vagina for 3 weeks, removed for 1 week, and then a new one is put back in place. The patient must be comfortable inserting and removing the ring from the vagina. A health care provider's prescription is necessary. °· Emergency contraception. Emergency contraceptives prevent pregnancy after unprotected sexual intercourse. This pill can be taken right after sex or up to 5 days after unprotected sex. It is most effective the sooner you take the pills after having sexual intercourse. Most emergency contraceptive pills are available without a prescription. Check with your pharmacist. Do not use emergency contraception as your only form of birth control. °BARRIER METHODS  °· Female condom. This is a thin sheath (latex or rubber) that is worn over the penis during sexual intercourse. It can be used with spermicide to increase effectiveness. °· Female condom. This is a soft, loose-fitting sheath that is put into the vagina before sexual intercourse. °· Diaphragm. This is a soft, latex, dome-shaped barrier that   must be fitted by a health care provider. It is inserted into the vagina, along with a spermicidal jelly. It is inserted before intercourse. The diaphragm should be left in the vagina for 6 to 8 hours after intercourse. °· Cervical cap. This is a round, soft, latex or plastic cup that fits over the cervix and must be fitted by a health care provider. The cap can be left in place for up to 48 hours after intercourse. °· Sponge. This is a soft, circular piece of polyurethane foam. The sponge has spermicide in it. It is inserted into the vagina after wetting it and before sexual intercourse. °· Spermicides. These are chemicals that kill or block sperm from entering  the cervix and uterus. They come in the form of creams, jellies, suppositories, foam, or tablets. They do not require a prescription. They are inserted into the vagina with an applicator before having sexual intercourse. The process must be repeated every time you have sexual intercourse. °INTRAUTERINE CONTRACEPTION °· Intrauterine device (IUD). This is a T-shaped device that is put in a woman's uterus during a menstrual period to prevent pregnancy. There are 2 types: °¨ Copper IUD. This type of IUD is wrapped in copper wire and is placed inside the uterus. Copper makes the uterus and fallopian tubes produce a fluid that kills sperm. It can stay in place for 10 years. °¨ Hormone IUD. This type of IUD contains the hormone progestin (synthetic progesterone). The hormone thickens the cervical mucus and prevents sperm from entering the uterus, and it also thins the uterine lining to prevent implantation of a fertilized egg. The hormone can weaken or kill the sperm that get into the uterus. It can stay in place for 3-5 years, depending on which type of IUD is used. °PERMANENT METHODS OF CONTRACEPTION °· Female tubal ligation. This is when the woman's fallopian tubes are surgically sealed, tied, or blocked to prevent the egg from traveling to the uterus. °· Hysteroscopic sterilization. This involves placing a small coil or insert into each fallopian tube. Your doctor uses a technique called hysteroscopy to do the procedure. The device causes scar tissue to form. This results in permanent blockage of the fallopian tubes, so the sperm cannot fertilize the egg. It takes about 3 months after the procedure for the tubes to become blocked. You must use another form of birth control for these 3 months. °· Female sterilization. This is when the female has the tubes that carry sperm tied off (vasectomy). This blocks sperm from entering the vagina during sexual intercourse. After the procedure, the man can still ejaculate fluid  (semen). °NATURAL PLANNING METHODS °· Natural family planning. This is not having sexual intercourse or using a barrier method (condom, diaphragm, cervical cap) on days the woman could become pregnant. °· Calendar method. This is keeping track of the length of each menstrual cycle and identifying when you are fertile. °· Ovulation method. This is avoiding sexual intercourse during ovulation. °· Symptothermal method. This is avoiding sexual intercourse during ovulation, using a thermometer and ovulation symptoms. °· Post-ovulation method. This is timing sexual intercourse after you have ovulated. °Regardless of which type or method of contraception you choose, it is important that you use condoms to protect against the transmission of sexually transmitted infections (STIs). Talk with your health care provider about which form of contraception is most appropriate for you. °Document Released: 04/28/2005 Document Revised: 05/03/2013 Document Reviewed: 10/21/2012 °ExitCare® Patient Information ©2015 [employer]. This information is not intended to replace advice   given to you by your health care provider. Make sure you discuss any questions you have with your health care provider. ° °

## 2014-01-05 ENCOUNTER — Inpatient Hospital Stay (HOSPITAL_COMMUNITY): Admission: RE | Admit: 2014-01-05 | Payer: MEDICAID | Source: Ambulatory Visit

## 2014-01-05 NOTE — Progress Notes (Signed)
Post discharge chart review completed.  

## 2014-01-09 NOTE — H&P (Signed)
Attestation of Attending Supervision of Advanced Practitioner (CNM/NP): Evaluation and management procedures were performed by the Advanced Practitioner under my supervision and collaboration.  I have reviewed the Advanced Practitioner's note and chart, and I agree with the management and plan.  Barnet Benavides 01/09/2014 8:50 AM

## 2014-02-10 ENCOUNTER — Encounter: Payer: Self-pay | Admitting: Obstetrics and Gynecology

## 2014-02-10 ENCOUNTER — Telehealth: Payer: Self-pay

## 2014-02-10 ENCOUNTER — Ambulatory Visit: Payer: Medicaid Other | Admitting: Obstetrics and Gynecology

## 2014-02-10 NOTE — Telephone Encounter (Signed)
Called pt and left message that she had an appt today scheduled at 1020 am if she could please call and reschedule her appt.   Sent letter.

## 2014-03-13 ENCOUNTER — Encounter (HOSPITAL_COMMUNITY): Payer: Self-pay | Admitting: *Deleted

## 2014-04-05 ENCOUNTER — Encounter: Payer: Self-pay | Admitting: Obstetrics & Gynecology

## 2014-05-24 ENCOUNTER — Ambulatory Visit: Payer: Medicaid Other | Admitting: Obstetrics & Gynecology

## 2014-06-29 ENCOUNTER — Ambulatory Visit (INDEPENDENT_AMBULATORY_CARE_PROVIDER_SITE_OTHER): Payer: Medicaid Other | Admitting: Family Medicine

## 2014-06-29 ENCOUNTER — Encounter: Payer: Self-pay | Admitting: Family Medicine

## 2014-06-29 VITALS — BP 136/76 | HR 88 | Wt 219.5 lb

## 2014-06-29 DIAGNOSIS — Z3042 Encounter for surveillance of injectable contraceptive: Secondary | ICD-10-CM

## 2014-06-29 DIAGNOSIS — M25532 Pain in left wrist: Secondary | ICD-10-CM

## 2014-06-29 NOTE — Progress Notes (Signed)
Patient ID: Meagan Stephens, female   DOB: February 27, 1986, 29 y.o.   MRN: [id number] Pt would like Depo Provera for birth control, pt states she has had unprotected sex within the past 2 weeks.

## 2014-06-29 NOTE — Addendum Note (Signed)
Addended by: Faythe CasaBELLAMY, Demontay Grantham M on: 06/29/2014 04:02 PM   Modules accepted: Orders

## 2014-06-29 NOTE — Progress Notes (Signed)
   Subjective:    Patient ID: Meagan Stephens, female    DOB: 1986/02/24, 29 y.o.   MRN: [id number]  HPI Seen for intermittent left wrist pain and popping, started when she was 6 months pregnant.  Denies injury.   Also wishes depo provera, although has had unprotected sex over the past 2 weeks.   Review of Systems  Constitutional: Negative for fever, chills and fatigue.  Genitourinary: Negative for vaginal bleeding, vaginal discharge, vaginal pain and dyspareunia.       Objective:   Physical Exam  Constitutional: She is oriented to person, place, and time. She appears well-developed and well-nourished.  Cardiovascular: Normal rate and regular rhythm.   Pulmonary/Chest: Effort normal and breath sounds normal. No respiratory distress. She has no wheezes. She has no rales. She exhibits no tenderness.  Neurological: She is alert and oriented to person, place, and time.  Psychiatric: She has a normal mood and affect. Her behavior is normal. Judgment and thought content normal.      Assessment & Plan:   Problem List Items Addressed This Visit    None    Visit Diagnoses    Left wrist pain    -  Primary    Relevant Orders    [facility]    Encounter for surveillance of injectable contraceptive          [facility] and [facility] pain. Return in 2 weeks for UPreg test.  Counseled pt to not have unprotected sex during this time.

## 2015-03-18 IMAGING — US US OB FOLLOW-UP
1 series · 12 of 28 positions shown · non-contrast
Comparison: none

[Series 1: us ob follow up · 12 of 44 slices shown]
[im 2/44]
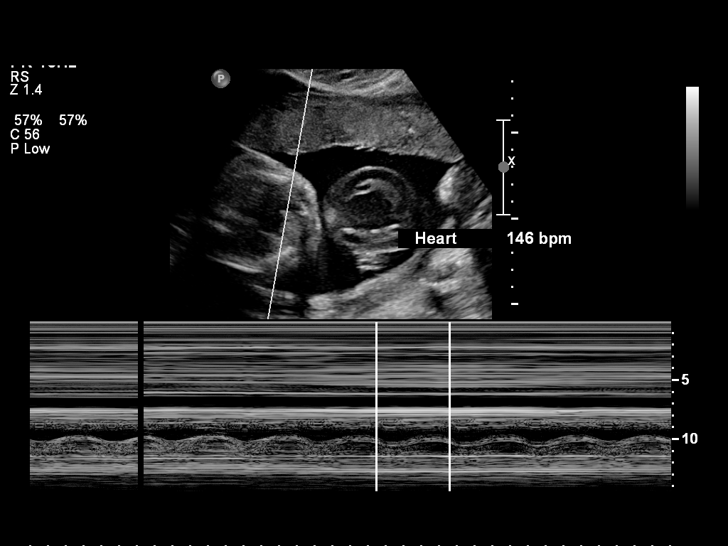
[im 5/44]
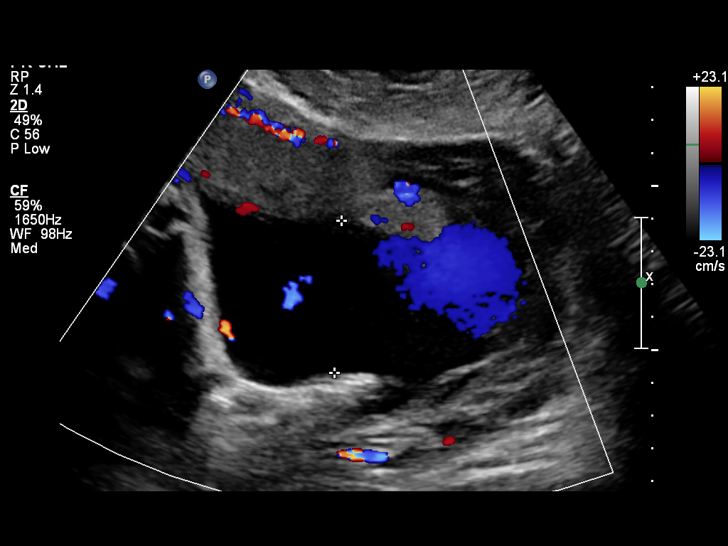
[im 8/44]
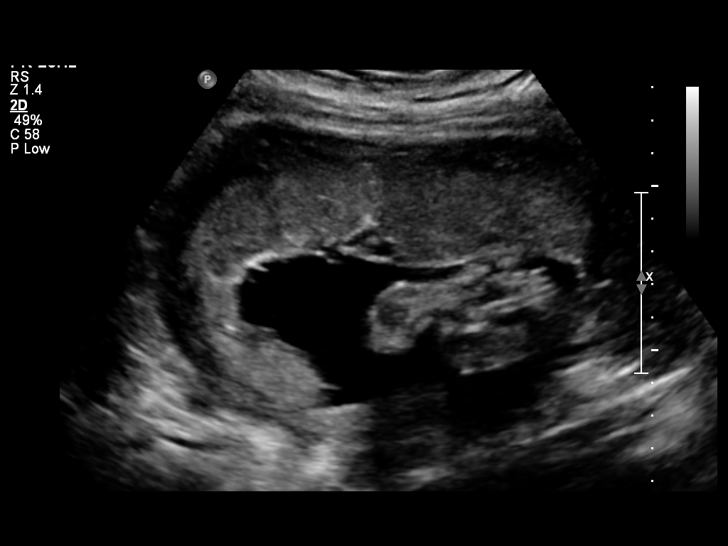
[im 13/44]
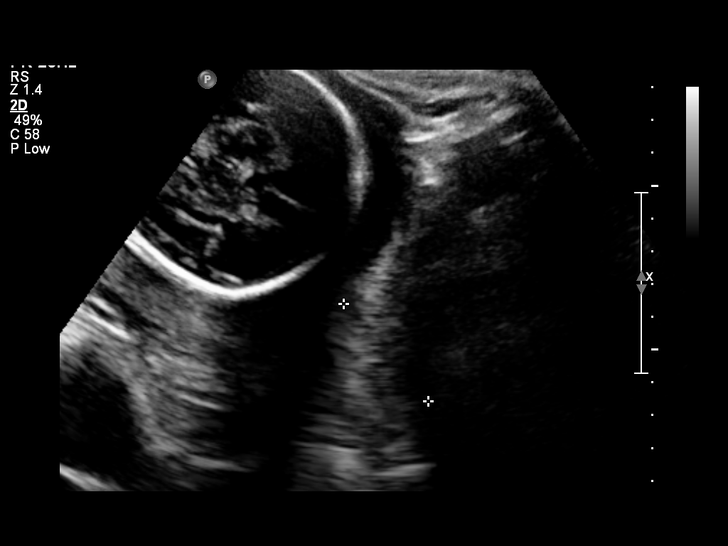
[im 16/44]
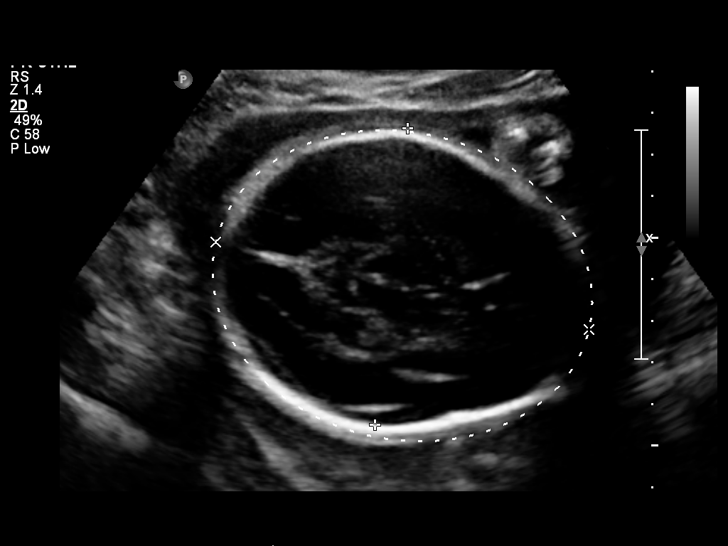
[im 20/44]
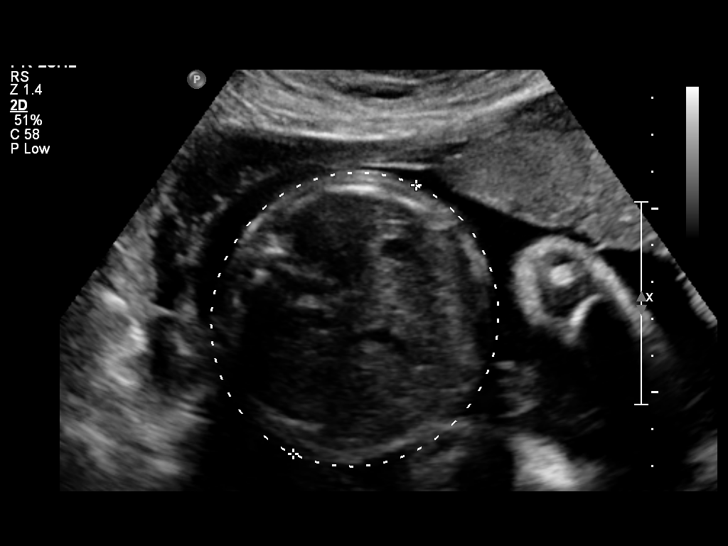
[im 24/44]
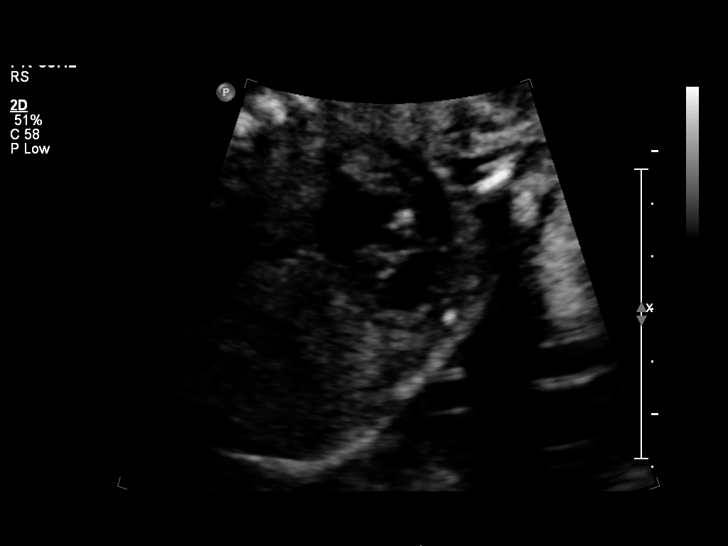
[im 28/44]
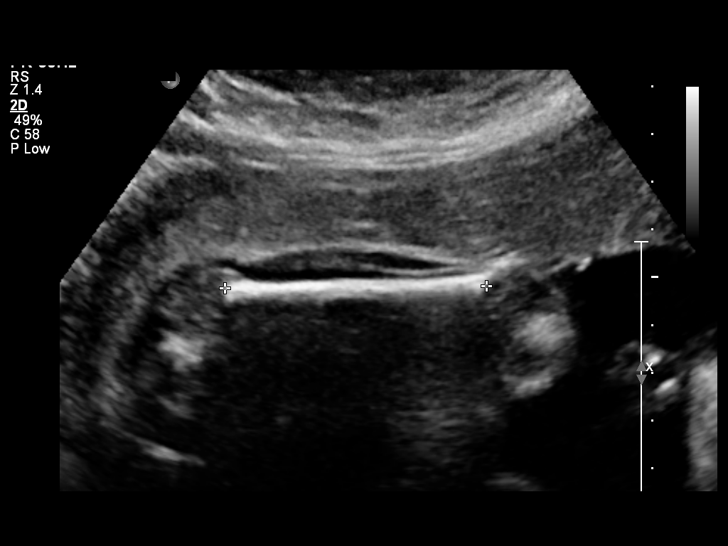
[im 31/44]
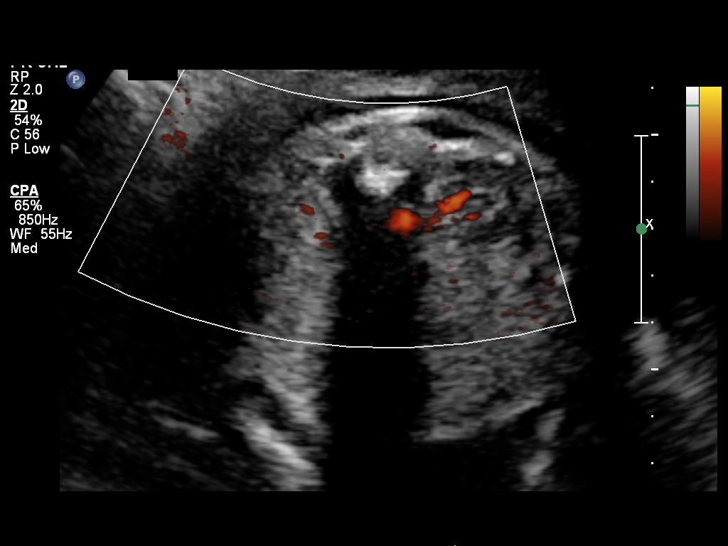
[im 36/44]
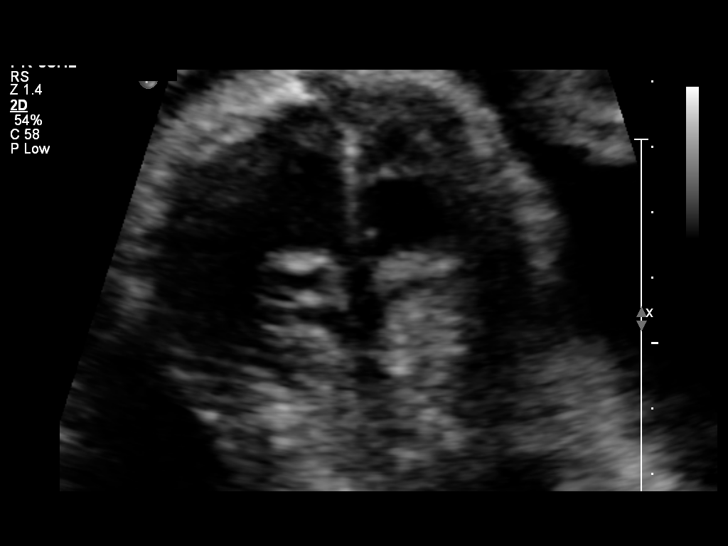
[im 39/44]
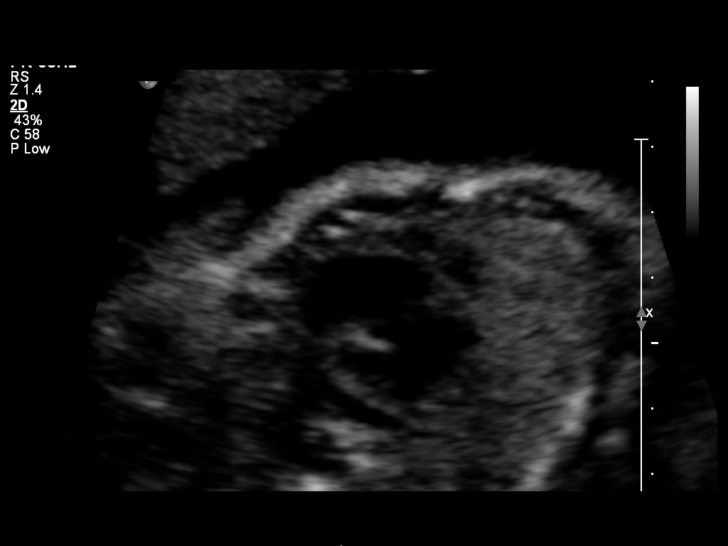
[im 42/44]
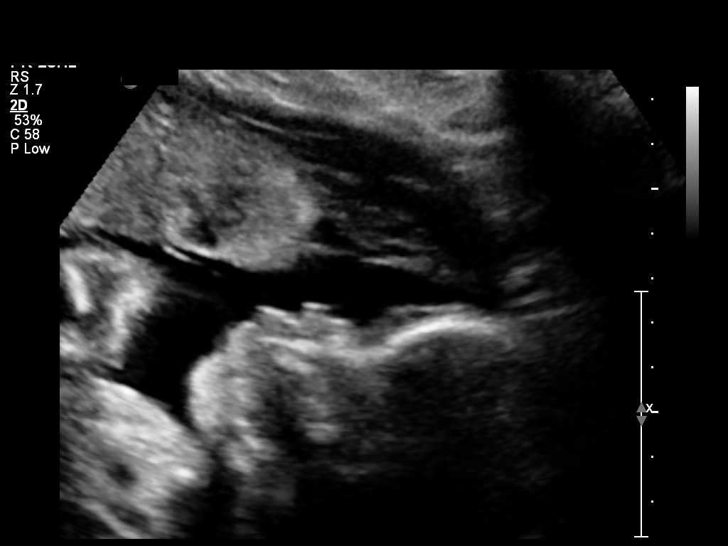

[12 of 28 positions shown; findings below may reference images not displayed]

OBSTETRICS REPORT
                      (Signed Final 10/06/2013 [DATE])

Service(s) Provided

 US OB FOLLOW UP                                       76816.1
Indications

 Follow-up incomplete fetal anatomic evaluation
Fetal Evaluation

 Num Of Fetuses:    1
 Fetal Heart Rate:  146                          bpm
 Cardiac Activity:  Observed
 Presentation:      Cephalic
 Placenta:          Anterior, above cervical os
 P. Cord            Visualized, central
 Insertion:

 Amniotic Fluid
 AFI FV:      Subjectively within normal limits
 AFI Sum:     15.57   cm       55  %Tile     Larg Pckt:    4.62  cm
 RUQ:   3.26    cm   RLQ:    4.62   cm    LUQ:   4.3     cm   LLQ:    3.39   cm
Biometry

 BPD:     71.2  mm     G. Age:  28w 4d                CI:        75.22   70 - 86
                                                      FL/HC:      21.4   18.8 -

 HC:     260.4  mm     G. Age:  28w 2d       24  %    HC/AC:      1.06   1.05 -

 AC:     246.4  mm     G. Age:  28w 6d       65  %    FL/BPD:     78.4   71 - 87
 FL:      55.8  mm     G. Age:  29w 3d       71  %    FL/AC:      22.6   20 - 24

 Est. FW:    4848  gm    2 lb 14 oz      67  %
Gestational Age

 LMP:           26w 0d        Date:  04/07/13                 EDD:   01/12/14
 U/S Today:     28w 6d                                        EDD:   12/23/13
 Best:          28w 1d     Det. By:  U/S (08/11/13)           EDD:   12/28/13
Anatomy

 Cranium:          Appears normal         Aortic Arch:      Appears normal
 Fetal Cavum:      Previously seen        Ductal Arch:      Appears normal
 Ventricles:       Appears normal         Diaphragm:        Appears normal
 Choroid Plexus:   Previously seen        Stomach:          Appears normal, left
                                                            sided
 Cerebellum:       Previously seen        Abdomen:          Appears normal
 Posterior Fossa:  Previously seen        Abdominal Wall:   Previously seen
 Nuchal Fold:      Not applicable (>20    Cord Vessels:     Previously seen
                   wks GA)
 Face:             Orbits previously      Kidneys:          Appear normal
                   seen
 Lips:             Previously seen        Bladder:          Appears normal
 Heart:            Appears normal         Spine:            Previously seen
                   (4CH, axis, and
                   situs)
 RVOT:             Appears normal         Lower             Previously seen
                                          Extremities:
 LVOT:             Appears normal         Upper             Previously seen
                                          Extremities:

 Other:  Fetus appears to be a female. Heels and 5th digit visualized. Nasal
         bone prev. visualized. Technically difficult due to fetal position.
Cervix Uterus Adnexa

 Cervical Length:    3.5      cm

 Cervix:       Normal appearance by transabdominal scan.
Impression

 SIUP at 28+1 weeks
 Normal interval anatomy; anatomic survey complete
 Normal amniotic fluid volume
 Appropriate interval growth with EFW at the 67th %tile
Recommendations

 Follow-up as clinically indicated

 questions or concerns.

## 2016-04-16 ENCOUNTER — Encounter (HOSPITAL_COMMUNITY): Payer: Self-pay | Admitting: *Deleted

## 2016-04-16 ENCOUNTER — Inpatient Hospital Stay (HOSPITAL_COMMUNITY)
Admission: AD | Admit: 2016-04-16 | Discharge: 2016-04-16 | Disposition: A | Discharge status: Home / Self Care | Payer: Medicaid Other | Source: Ambulatory Visit | Attending: Family Medicine | Admitting: Family Medicine

## 2016-04-16 DIAGNOSIS — F17210 Nicotine dependence, cigarettes, uncomplicated: Secondary | ICD-10-CM | POA: Diagnosis not present

## 2016-04-16 DIAGNOSIS — Z3202 Encounter for pregnancy test, result negative: Secondary | ICD-10-CM | POA: Diagnosis not present

## 2016-04-16 DIAGNOSIS — Z202 Contact with and (suspected) exposure to infections with a predominantly sexual mode of transmission: Secondary | ICD-10-CM | POA: Diagnosis not present

## 2016-04-16 DIAGNOSIS — N939 Abnormal uterine and vaginal bleeding, unspecified: Secondary | ICD-10-CM

## 2016-04-16 HISTORY — DX: Chlamydial infection, unspecified: A74.9

## 2016-04-16 LAB — URINALYSIS, ROUTINE W REFLEX MICROSCOPIC
BACTERIA UA: NONE SEEN
Bilirubin Urine: NEGATIVE
Glucose, UA: NEGATIVE mg/dL
Ketones, ur: NEGATIVE mg/dL
LEUKOCYTES UA: NEGATIVE
Nitrite: NEGATIVE
PH: 7 (ref 5.0–8.0)
Protein, ur: 100 mg/dL — AB
SPECIFIC GRAVITY, URINE: 1.02 (ref 1.005–1.030)

## 2016-04-16 LAB — CBC
HCT: 31.9 % — ABNORMAL LOW (ref 36.0–46.0)
Hemoglobin: 10.4 g/dL — ABNORMAL LOW (ref 12.0–15.0)
MCH: 28.3 pg (ref 26.0–34.0)
MCHC: 32.6 g/dL (ref 30.0–36.0)
MCV: 86.7 fL (ref 78.0–100.0)
PLATELETS: 260 10*3/uL (ref 150–400)
RBC: 3.68 MIL/uL — ABNORMAL LOW (ref 3.87–5.11)
RDW: 14.8 % (ref 11.5–15.5)
WBC: 10.6 10*3/uL — AB (ref 4.0–10.5)

## 2016-04-16 LAB — WET PREP, GENITAL
Clue Cells Wet Prep HPF POC: NONE SEEN
Sperm: NONE SEEN
Trich, Wet Prep: NONE SEEN
Yeast Wet Prep HPF POC: NONE SEEN

## 2016-04-16 LAB — POCT PREGNANCY, URINE: Preg Test, Ur: NEGATIVE

## 2016-04-16 MED ORDER — CEFTRIAXONE SODIUM 250 MG IJ SOLR
250.0000 mg | Freq: Once | INTRAMUSCULAR | Status: AC
  Administered 2016-04-16: 250 mg via INTRAMUSCULAR
  Filled 2016-04-16: qty 250

## 2016-04-16 MED ORDER — MEDROXYPROGESTERONE ACETATE 5 MG PO TABS: 5.0000 mg | ORAL_TABLET | Freq: Every day | ORAL | 0 refills | Status: DC

## 2016-04-16 MED ORDER — AZITHROMYCIN 250 MG PO TABS
1000.0000 mg | ORAL_TABLET | Freq: Once | ORAL | Status: AC
  Administered 2016-04-16: 1000 mg via ORAL
  Filled 2016-04-16: qty 4

## 2016-04-16 NOTE — MAU Note (Addendum)
Period came on early, initially was light and spotty, cleared up then had  unprotected sex last wk..on Sunday, started bleeding heavy, soaking pads Mon and Tues, was having to change hourly.  Got a call today from partner that he had chlamydia.

## 2016-04-16 NOTE — Discharge Instructions (Signed)
Abnormal Uterine Bleeding Abnormal uterine bleeding means bleeding from the vagina that is not your normal menstrual period. This can be:  Bleeding or spotting between periods.  Bleeding after sex (sexual intercourse).  Bleeding that is heavier or more than normal.  Periods that last longer than usual.  Bleeding after menopause. There are many problems that may cause this. Treatment will depend on the cause of the bleeding. Any kind of bleeding that is not normal should be reviewed by your doctor. Follow these instructions at home: Watch your condition for any changes. These actions may lessen any discomfort you are having:  Do not use tampons or douches as told by your doctor.  Change your pads often. You should get regular pelvic exams and Pap tests. Keep all appointments for tests as told by your doctor. Contact a doctor if:  You are bleeding for more than 1 week.  You feel dizzy at times. Get help right away if:  You pass out.  You have to change pads every 15 to 30 minutes.  You have belly pain.  You have a fever.  You become sweaty or weak.  You are passing large blood clots from the vagina.  You feel sick to your stomach (nauseous) and throw up (vomit). This information is not intended to replace advice given to you by your health care provider. Make sure you discuss any questions you have with your health care provider. Document Released: 02/23/2009 Document Revised: 10/04/2015 Document Reviewed: 11/25/2012 Elsevier Interactive Patient Education  2017 [employer]  

## 2016-04-16 NOTE — MAU Provider Note (Signed)
Chief Complaint: Vaginal Bleeding   SUBJECTIVE HPI: Meagan Stephens is a 30 y.o. [id number] at Unknown who presents to [facility] Admissions reporting abnormal vaginal bleeding.  LMP: 03/04/16 (normal) November: started 04/01/16 lighter than normal, spotting, then normal.  Had intercourse with partner (1 year), after menses, then had period restart within a week of LMP in November, started on Sunday, 3 days ago. It was heavy, soaking overnight pads every hour with clots size of golf ball. Still bleeding just as heavy with clots. Mild cramping, but no severe pain.   Boyfriend told her today he is +chlamydia. No fevers or chills. No dyspareunia. No abnormal vaginal discharge.  Menarche: 30 y/o Menses: Regular, 28 days. 3-5 days, day 1 heavy, soaks pad maybe every 4-5 hours, no clots. Minimal cramping.     Past Medical History:  Diagnosis Date  . Chlamydia   . Medical history non-contributory    OB History  Gravida Para Term Preterm AB Living  3 1 1   2 1   SAB TAB Ectopic Multiple Live Births  0 2     1    # Outcome Date GA Lbr Len/2nd Weight Sex Delivery Anes PTL Lv  3 Term 01/03/14 [id number] 37:50 8 lb 5.2 oz (3.775 kg) F Vag-Spont EPI  LIV     Birth Comments: WNL  2 TAB 2012          1 TAB 2012             Past Surgical History:  Procedure Laterality Date  . WISDOM TOOTH EXTRACTION     Social History   Social History  . Marital status: Single    Spouse name: N/A  . Number of children: N/A  . Years of education: N/A   Occupational History  . Not on file.   Social History Main Topics  . Smoking status: Current Some Day Smoker    Packs/day: 0.25    Years: 5.00    Types: Cigarettes  . Smokeless tobacco: Never Used  . Alcohol use Yes     Comment: Social  . Drug use: No     Comment: none in many years  . Sexual activity: Yes    Birth control/ protection: None   Other Topics Concern  . Not on file   Social History Narrative  . No narrative on file   No current  facility-administered medications on file prior to encounter.    Current Outpatient Prescriptions on File Prior to Encounter  Medication Sig Dispense Refill  . ibuprofen (ADVIL,MOTRIN) 600 MG tablet Take 1 tablet (600 mg total) by mouth every 6 (six) hours. (Patient not taking: Reported on 06/29/2014) 30 tablet 0  . Prenatal Vit-Fe Fumarate-FA (PRENATAL VITAMINS) 28-0.8 MG TABS Take 1 tablet by mouth daily. (Patient not taking: Reported on 06/29/2014) 30 tablet 10   No Known Allergies  I have reviewed the past Medical Hx, Surgical Hx, Social Hx, Allergies and Medications.   REVIEW OF SYSTEMS  A comprehensive ROS was negative except per HPI.     OBJECTIVE Patient Vitals for the past 24 hrs:  BP Temp Temp src Pulse Resp Weight  04/16/16 1543 134/71 98.6 F (37 C) Oral 116 18 217 lb 12 oz (98.8 kg)    PHYSICAL EXAM Constitutional: Well-developed, well-nourished female in no acute distress.  Cardiovascular: normal rate, rhythm, no murmurs Respiratory: normal rate and effort. CTAB GI: Abd soft, non-tender, non-distended. Pos BS x 4 MS: Extremities nontender, no edema, normal ROM Neurologic: Alert and oriented  x 4.  GU: Neg CVAT. SPECULUM EXAM: NEFG, active bleeding from cervical os, about 5cc old blood in vault with clots, cervix clean BIMANUAL: cervix closed; uterus normal size, no adnexal tenderness or masses. No CMT.  LAB RESULTS Results for orders placed or performed during the hospital encounter of 04/16/16 (from the past 24 hour(s))  Urinalysis, Routine w reflex microscopic     Status: Abnormal   Collection Time: 04/16/16  3:50 PM  Result Value Ref Range   Color, Urine YELLOW YELLOW   APPearance HAZY (A) CLEAR   Specific Gravity, Urine 1.020 1.005 - 1.030   pH 7.0 5.0 - 8.0   Glucose, UA NEGATIVE NEGATIVE mg/dL   Hgb urine dipstick LARGE (A) NEGATIVE   Bilirubin Urine NEGATIVE NEGATIVE   Ketones, ur NEGATIVE NEGATIVE mg/dL   Protein, ur 454100 (A) NEGATIVE mg/dL   Nitrite  NEGATIVE NEGATIVE   Leukocytes, UA NEGATIVE NEGATIVE   RBC / HPF TOO NUMEROUS TO COUNT 0 - 5 RBC/hpf   WBC, UA 6-30 0 - 5 WBC/hpf   Bacteria, UA NONE SEEN NONE SEEN   Squamous Epithelial / LPF 6-30 (A) NONE SEEN   Mucous PRESENT   Pregnancy, urine POC     Status: None   Collection Time: 04/16/16  4:02 PM  Result Value Ref Range   Preg Test, Ur NEGATIVE NEGATIVE  Wet prep, genital     Status: Abnormal   Collection Time: 04/16/16  5:16 PM  Result Value Ref Range   Yeast Wet Prep HPF POC NONE SEEN NONE SEEN   Trich, Wet Prep NONE SEEN NONE SEEN   Clue Cells Wet Prep HPF POC NONE SEEN NONE SEEN   WBC, Wet Prep HPF POC FEW (A) NONE SEEN   Sperm NONE SEEN   CBC     Status: Abnormal   Collection Time: 04/16/16  5:26 PM  Result Value Ref Range   WBC 10.6 (H) 4.0 - 10.5 K/uL   RBC 3.68 (L) 3.87 - 5.11 MIL/uL   Hemoglobin 10.4 (L) 12.0 - 15.0 g/dL   HCT 09.831.9 (L) 11.936.0 - 14.746.0 %   MCV 86.7 78.0 - 100.0 fL   MCH 28.3 26.0 - 34.0 pg   MCHC 32.6 30.0 - 36.0 g/dL   RDW 82.914.8 56.211.5 - 13.015.5 %   Platelets 260 150 - 400 K/uL    IMAGING No results found.  MAU COURSE CBC SVE Wet Prep GC/CT Treatment for STD exposure (boyfriend told patient he has chlamydia)  MDM Plan of care reviewed with patient, including labs and tests ordered and medical treatment. Given treatment for exposure to chlamydia (treated for both GC and CT).   ASSESSMENT 1. Abnormal uterine bleeding (AUB)   2. Exposure to sexually transmitted disease (STD)     PLAN Discharge home in stable condition. Provera to stop AUB Treated for STI exposure (Rocephin and Azithro)     Medication List    STOP taking these medications   ibuprofen 600 MG tablet Commonly known as:  ADVIL,MOTRIN     TAKE these medications   medroxyPROGESTERone 5 MG tablet Commonly known as:  PROVERA Take 1 tablet (5 mg total) by mouth daily.        Jen MowElizabeth Marra Fraga, DO OB Fellow 04/16/2016 4:43 PM

## 2016-04-18 LAB — GC/CHLAMYDIA PROBE AMP (~~LOC~~) NOT AT ARMC
Chlamydia: NEGATIVE
Neisseria Gonorrhea: NEGATIVE

## 2022-11-19 ENCOUNTER — Encounter: Payer: Self-pay | Admitting: Emergency Medicine

## 2022-11-19 ENCOUNTER — Ambulatory Visit
Admission: EM | Admit: 2022-11-19 | Discharge: 2022-11-19 | Disposition: A | Discharge status: Home / Self Care | Payer: BC Managed Care – PPO | Attending: Family Medicine | Admitting: Family Medicine

## 2022-11-19 ENCOUNTER — Other Ambulatory Visit: Payer: Self-pay

## 2022-11-19 DIAGNOSIS — Z113 Encounter for screening for infections with a predominantly sexual mode of transmission: Secondary | ICD-10-CM | POA: Insufficient documentation

## 2022-11-19 DIAGNOSIS — J029 Acute pharyngitis, unspecified: Secondary | ICD-10-CM | POA: Diagnosis present

## 2022-11-19 DIAGNOSIS — J069 Acute upper respiratory infection, unspecified: Secondary | ICD-10-CM | POA: Insufficient documentation

## 2022-11-19 MED ORDER — FLUTICASONE PROPIONATE 50 MCG/ACT NA SUSP: 1.0000 | Freq: Two times a day (BID) | NASAL | 0 refills | Status: DC | PRN

## 2022-11-19 MED ORDER — PROMETHAZINE-DM 6.25-15 MG/5ML PO SYRP: 5.0000 mL | ORAL_SOLUTION | Freq: Three times a day (TID) | ORAL | 0 refills | Status: DC | PRN

## 2022-11-19 MED ORDER — CETIRIZINE HCL 10 MG PO TABS: 10.0000 mg | ORAL_TABLET | Freq: Every day | ORAL | 0 refills | Status: DC | PRN

## 2022-11-19 NOTE — ED Provider Notes (Signed)
[facility]    CSN: [id number] Arrival date & time: 11/19/22  0806      History   Chief Complaint Chief Complaint  Patient presents with   Sore Throat    HPI Meagan Stephens is a 37 y.o. female.   HPI Patient heretoday with 2 concerns, URI symptoms and STD testing.  Patient reports 3 days ago developing a sore throat, [place] involving the left ear, cough which is nonproductive.  She also is concerned for possible STD.  She reports engagement in unprotected sexual intercourse.  She denies any symptoms of vaginal discharge or irritation.  She was unaware that you can get STDs in the oral cavity and would like to have STD testing in both her throat and vaginal area.  She is asymptomatic of fever reports that she has been taking ibuprofen taking any other OTC medications.  Denies any known sick contacts.  Past Medical History:  Diagnosis Date   Chlamydia    Medical history non-contributory     Patient Active Problem List   Diagnosis Date Noted   Active labor at term 01/02/2014   Chlamydia infection complicating pregnancy in second trimester 08/10/2013   Flu vaccine need 08/09/2013   Supervision of normal pregnancy 08/09/2013    Past Surgical History:  Procedure Laterality Date   WISDOM TOOTH EXTRACTION      OB History     Gravida  3   Para  1   Term  1   Preterm      AB  2   Living  1      SAB  0   IAB  2   Ectopic      Multiple      Live Births  1            Home Medications    Prior to Admission medications   Medication Sig Start Date End Date Taking? Authorizing Provider  cetirizine (ZYRTEC ALLERGY) 10 MG tablet Take 1 tablet (10 mg total) by mouth daily as needed for allergies. 11/19/22  Yes Bing Neighbors, NP  fluticasone (FLONASE) 50 MCG/ACT nasal spray Place 1 spray into both nostrils 2 (two) times daily as needed for allergies or rhinitis. 11/19/22  Yes Bing Neighbors, NP  promethazine-dextromethorphan  (PROMETHAZINE-DM) 6.25-15 MG/5ML syrup Take 5 mLs by mouth 3 (three) times daily as needed for cough. 11/19/22  Yes Bing Neighbors, NP  medroxyPROGESTERone (PROVERA) 5 MG tablet Take 1 tablet (5 mg total) by mouth daily. Patient not taking: Reported on 11/19/2022 04/16/16   Donette Larry, CNM    Family History Family History  Problem Relation Age of Onset   Hypertension Mother    Diabetes Maternal Grandmother    Hypertension Maternal Grandmother     Social History Social History   Tobacco Use   Smoking status: Some Days    Packs/day: 0.25    Years: 5.00    Additional pack years: 0.00    Total pack years: 1.25    Types: Cigarettes   Smokeless tobacco: Never  Vaping Use   Vaping Use: Some days  Substance Use Topics   Alcohol use: Yes    Comment: Social   Drug use: No    Types: Marijuana    Comment: none in many years     Allergies   Patient has no known allergies.   Review of Systems Review of Systems Pertinent negatives listed in HPI   Physical Exam Triage Vital Signs ED Triage Vitals  Enc Vitals Group     BP 11/19/22 0833 (!) 138/95     Pulse Rate 11/19/22 0833 96     Resp 11/19/22 0833 18     Temp 11/19/22 0833 98.2 F (36.8 C)     Temp src --      SpO2 11/19/22 0833 98 %     Weight --      Height --      Head Circumference --      Peak Flow --      Pain Score 11/19/22 0831 6     Pain Loc --      Pain Edu? --      Excl. in GC? --    No data found.  Updated Vital Signs BP (!) 138/95 (BP Location: Left Arm) Comment: large cuff  Pulse 96   Temp 98.2 F (36.8 C)   Resp 18   LMP 10/31/2022 (Exact Date)   SpO2 98%   Visual Acuity Right Eye Distance:   Left Eye Distance:   Bilateral Distance:    Right Eye Near:   Left Eye Near:    Bilateral Near:     Physical Exam Vitals and nursing note reviewed.  Constitutional:      Appearance: She is well-developed.  HENT:     Head: Normocephalic and atraumatic.     Right Ear: Tympanic  membrane and ear canal normal.     Left Ear: Tympanic membrane normal.     Nose: Congestion and rhinorrhea present.     Mouth/Throat:     Mouth: Mucous membranes are moist.     Pharynx: Uvula midline. No pharyngeal swelling or posterior oropharyngeal erythema.     Tonsils: 0 on the right. 0 on the left.  Eyes:     Conjunctiva/sclera: Conjunctivae normal.     Pupils: Pupils are equal, round, and reactive to light.  Cardiovascular:     Rate and Rhythm: Normal rate and regular rhythm.  Pulmonary:     Effort: Pulmonary effort is normal.     Breath sounds: Normal breath sounds.  Musculoskeletal:     Cervical back: Normal range of motion and neck supple.  Lymphadenopathy:     Cervical: No cervical adenopathy.  Skin:    General: Skin is warm and dry.     Capillary Refill: Capillary refill takes less than 2 seconds.  Neurological:     General: No focal deficit present.     Mental Status: She is alert.      UC Treatments / Results  Labs (all labs ordered are listed, but only abnormal results are displayed) Labs Reviewed  HIV ANTIBODY (ROUTINE TESTING W REFLEX)  RPR  CYTOLOGY, (ORAL, ANAL, URETHRAL) ANCILLARY ONLY  CERVICOVAGINAL ANCILLARY ONLY    EKG   Radiology No results found.  Procedures Procedures (including critical care time)  Medications Ordered in UC Medications - No data to display  Initial Impression / Assessment and Plan / UC Course  I have reviewed the triage vital signs and the nursing notes.  Pertinent labs & imaging results that were available during my care of the patient were reviewed by me and considered in my medical decision making (see chart for details).    Patient with sore throat without erythema or exudate with nasal congestion and rhinorrhea present on exam. Treating for an upper respiratory tract infection which indicates symptomatic treatment only. Patient is concerned for STDs as she has recently had unprotected oral and vaginal  intercourse.  STD cytology and  HIV and RPR pending.  Advised that she will only be notified if her test results are abnormal.  Education provided regarding preventing STD.  Patient verbalized understanding agreement with plan. Final Clinical Impressions(s) / UC Diagnoses   Final diagnoses:  Upper respiratory tract infection, unspecified type  Screen for STD (sexually transmitted disease)  Sore throat     Discharge Instructions      STD test results will be available within 24 to 48 hours.  Our office will only contact you if your results are abnormal.  You will need to activate MyChart to be able to review your results. I am treating you for an upper respiratory infection which is viral.  Use medications as indicated.     ED Prescriptions     Medication Sig Dispense Auth. Provider   cetirizine (ZYRTEC ALLERGY) 10 MG tablet Take 1 tablet (10 mg total) by mouth daily as needed for allergies. 30 tablet Bing Neighbors, NP   fluticasone (FLONASE) 50 MCG/ACT nasal spray Place 1 spray into both nostrils 2 (two) times daily as needed for allergies or rhinitis. 9.9 mL Bing Neighbors, NP   promethazine-dextromethorphan (PROMETHAZINE-DM) 6.25-15 MG/5ML syrup Take 5 mLs by mouth 3 (three) times daily as needed for cough. 118 mL Bing Neighbors, NP      PDMP not reviewed this encounter.   Bing Neighbors, NP 11/19/22 [phone]

## 2022-11-19 NOTE — ED Notes (Signed)
Told patient to be cautious about taking cough medicine-may make her sleepy

## 2022-11-19 NOTE — Discharge Instructions (Addendum)
STD test results will be available within 24 to 48 hours.  Our office will only contact you if your results are abnormal.  You will need to activate MyChart to be able to review your results. I am treating you for an upper respiratory infection which is viral.  Use medications as indicated.

## 2022-11-19 NOTE — ED Triage Notes (Addendum)
Sore throat since Sunday.  Left ear started hurting.  Minimal cough, but hurts.  Last night took tylenol, none today.  Has had ibuprofen prior to today  Patient has mentioned concerns for STD causing throat.

## 2022-11-20 LAB — CYTOLOGY, (ORAL, ANAL, URETHRAL) ANCILLARY ONLY
Chlamydia: NEGATIVE
Comment: NEGATIVE
Comment: NORMAL
Neisseria Gonorrhea: NEGATIVE

## 2022-11-20 LAB — CERVICOVAGINAL ANCILLARY ONLY
Bacterial Vaginitis (gardnerella): POSITIVE — AB
Candida Glabrata: NEGATIVE
Candida Vaginitis: POSITIVE — AB
Chlamydia: NEGATIVE
Comment: NEGATIVE
Comment: NEGATIVE
Comment: NEGATIVE
Comment: NEGATIVE
Comment: NEGATIVE
Comment: NORMAL
Neisseria Gonorrhea: NEGATIVE
Trichomonas: NEGATIVE

## 2022-11-20 LAB — HIV ANTIBODY (ROUTINE TESTING W REFLEX): HIV Screen 4th Generation wRfx: NONREACTIVE

## 2022-11-20 LAB — RPR: RPR Ser Ql: NONREACTIVE

## 2022-11-21 ENCOUNTER — Telehealth (HOSPITAL_COMMUNITY): Payer: Self-pay | Admitting: Emergency Medicine

## 2022-11-21 MED ORDER — FLUCONAZOLE 150 MG PO TABS: 150.0000 mg | ORAL_TABLET | Freq: Once | ORAL | 0 refills | Status: AC

## 2022-11-21 MED ORDER — METRONIDAZOLE 500 MG PO TABS: 500.0000 mg | ORAL_TABLET | Freq: Two times a day (BID) | ORAL | 0 refills | Status: DC

## 2022-12-09 ENCOUNTER — Telehealth (HOSPITAL_COMMUNITY): Payer: Self-pay | Admitting: Emergency Medicine

## 2022-12-09 NOTE — Telephone Encounter (Signed)
Patient called after receiving letter in the mail.  Updated her phone number, reviewed recent results and medicines, verified pharmacy

## 2023-04-21 ENCOUNTER — Encounter: Payer: Self-pay | Admitting: Emergency Medicine

## 2023-04-21 ENCOUNTER — Ambulatory Visit
Admission: EM | Admit: 2023-04-21 | Discharge: 2023-04-21 | Disposition: A | Discharge status: Home / Self Care | Payer: Medicaid Other | Attending: Family Medicine | Admitting: Family Medicine

## 2023-04-21 DIAGNOSIS — N898 Other specified noninflammatory disorders of vagina: Secondary | ICD-10-CM | POA: Insufficient documentation

## 2023-04-21 NOTE — ED Triage Notes (Signed)
Pt presents to U/C with occasional white discharge not everyday though she was treated in July for BV and just wanted a recheck done.

## 2023-04-22 LAB — CERVICOVAGINAL ANCILLARY ONLY
Bacterial Vaginitis (gardnerella): POSITIVE — AB
Comment: NEGATIVE

## 2023-04-22 NOTE — ED Provider Notes (Signed)
  [facility]   [id number] 04/21/23 Arrival Time: 1700  ASSESSMENT & PLAN:  1. Vaginal discharge    Declines STI testing. Just wants testing for BV.  Labs Reviewed  CERVICOVAGINAL ANCILLARY ONLY   Will notify of any positive results. Instructed to refrain from sexual activity for at least seven days.  Reviewed expectations re: course of current medical issues. Questions answered. Outlined signs and symptoms indicating need for more acute intervention. Patient verbalized understanding. After Visit Summary given.   SUBJECTIVE:  Meagan Stephens is a 37 y.o. female who presents with complaint of vaginal discharge. H/O BV; requests test for BV. Otherwise well. Mild vaginal d/c.  Patient's last menstrual period was 04/14/2023 (approximate).   OBJECTIVE:  Vitals:   04/21/23 1840  BP: (!) 141/99  Pulse: 89  Resp: 18  Temp: 97.9 F (36.6 C)  TempSrc: Oral  SpO2: 97%     General appearance: alert, cooperative, appears stated age and no distress GU: deferred Skin: warm and dry Psychological: alert and cooperative; normal mood and affect.    Labs Reviewed  CERVICOVAGINAL ANCILLARY ONLY    No Known Allergies  Past Medical History:  Diagnosis Date   Chlamydia    Medical history non-contributory    Family History  Problem Relation Age of Onset   Hypertension Mother    Diabetes Maternal Grandmother    Hypertension Maternal Grandmother    Social History   Socioeconomic History   Marital status: Single    Spouse name: Not on file   Number of children: Not on file   Years of education: Not on file   Highest education level: Not on file  Occupational History   Not on file  Tobacco Use   Smoking status: Some Days    Current packs/day: 0.25    Average packs/day: 0.3 packs/day for 5.0 years (1.3 ttl pk-yrs)    Types: Cigarettes   Smokeless tobacco: Never  Vaping Use   Vaping status: Some Days  Substance and Sexual Activity   Alcohol use: Yes     Comment: Social   Drug use: No    Types: Marijuana    Comment: none in many years   Sexual activity: Yes    Birth control/protection: None  Other Topics Concern   Not on file  Social History Narrative   Not on file   Social Determinants of Health   Financial Resource Strain: Not on file  Food Insecurity: Not on file  Transportation Needs: Not on file  Physical Activity: Not on file  Stress: Not on file  Social Connections: Unknown (09/09/2021)   Received from [place], [facility]   Social Network    Social Network: Not on file  Intimate Partner Violence: Unknown (08/16/2021)   Received from [facility], [facility]   HITS    Physically Hurt: Not on file    Insult or Talk Down To: Not on file    Threaten Physical Harm: Not on file    Scream or Curse: Not on file           Mardella Layman, MD 04/22/23 [phone]

## 2023-04-24 ENCOUNTER — Telehealth (HOSPITAL_COMMUNITY): Payer: Self-pay

## 2023-04-24 MED ORDER — METRONIDAZOLE 500 MG PO TABS: 500.0000 mg | ORAL_TABLET | Freq: Two times a day (BID) | ORAL | 0 refills | Status: AC

## 2023-04-24 NOTE — Telephone Encounter (Signed)
Per protocol, pt requires tx with metronidazole. Rx sent to pharmacy on file.

## 2024-04-15 ENCOUNTER — Encounter: Payer: Self-pay | Admitting: *Deleted

## 2024-04-15 ENCOUNTER — Ambulatory Visit
Admission: EM | Admit: 2024-04-15 | Discharge: 2024-04-15 | Disposition: A | Discharge status: Home / Self Care | Attending: Physician Assistant | Admitting: Physician Assistant

## 2024-04-15 DIAGNOSIS — K921 Melena: Secondary | ICD-10-CM

## 2024-04-15 DIAGNOSIS — R000 Tachycardia, unspecified: Secondary | ICD-10-CM | POA: Diagnosis not present

## 2024-04-15 DIAGNOSIS — N898 Other specified noninflammatory disorders of vagina: Secondary | ICD-10-CM | POA: Diagnosis not present

## 2024-04-15 DIAGNOSIS — R42 Dizziness and giddiness: Secondary | ICD-10-CM

## 2024-04-15 LAB — POCT URINE DIPSTICK
Blood, UA: NEGATIVE
Glucose, UA: NEGATIVE mg/dL
Leukocytes, UA: NEGATIVE
Nitrite, UA: NEGATIVE
POC PROTEIN,UA: 30 — AB
Spec Grav, UA: >=1.03 — AB (ref 1.010–1.025)
Urobilinogen, UA: 0.2 U/dL
pH, UA: 5.5 (ref 5.0–8.0)

## 2024-04-15 LAB — POCT URINE PREGNANCY: Preg Test, Ur: NEGATIVE

## 2024-04-15 LAB — GLUCOSE, POCT (MANUAL RESULT ENTRY): POCT Glucose (KUC): 120 mg/dL — AB (ref 70–99)

## 2024-04-15 LAB — POC HEMOCCULT BLD/STL (OFFICE/1-CARD/DIAGNOSTIC): Fecal Occult Blood, POC: NEGATIVE

## 2024-04-15 NOTE — Discharge Instructions (Signed)
 I will contact you if any of your blood work is abnormal.  Your EKG was reassuring.  There was not any blood in your stool today but I would like you to follow-up with a gastroenterologist so they can investigate this further.  Please call them to schedule an appointment.  Make sure that you are resting and drinking plenty of fluid as your urine did show that you are slightly dehydrated.  Wear loosefitting cotton underwear and use hypoallergenic soaps and detergents.  We will contact you if your swab is positive and we need to arrange additional treatment.  Please follow-up with your primary care as soon as possible for further evaluation and management.  If anything worsens and you have severe abdominal pain, nausea/vomiting interfere with oral intake, weakness, recurrent blood in your stool, pelvic pain, heart racing you need to be seen immediately.

## 2024-04-15 NOTE — ED Provider Notes (Signed)
 [facility]    CSN: [id number] Arrival date & time: 04/15/24  1336      History   Chief Complaint Chief Complaint  Patient presents with   Vaginal Discharge   Dizziness    HPI Cimone Fahey is a 38 y.o. female.   Patient presents today with several concerns.  Her primary concern today is a several week history of intermittent lightheadedness that she describes as though she is going to pass out.  She has not passed out but does occasionally have associated palpitations.  She has not identified any specific triggers and reports that she is eating and drinking normally.  She denies any recent head injury or medication changes.  She denies any recent illness or additional symptoms including cough, congestion, fever, nausea, vomiting.  She does have more frequent menstrual cycles but denies significant menorrhagia.  She has had intermittent blood on the toilet tissue and in the toilet bowl with the last episode about 1 month ago.  She has not seen primary care or ever had a colonoscopy.  She does not take any blood thinning medication.  She denies any significant past medical history including severe anemia, diabetes, cardiovascular disease, arrhythmia, thyroid condition.  She is having difficulty with her daily duties as a result of the symptoms.  In addition, she reports a prolonged history of increased clear vaginal discharge.  She does not associate any odor.  She denies any pelvic pain, abdominal pain, fever, nausea, vomiting.  Denies any changes to personal hygiene products including soaps or detergents.  Denies any recent antibiotic use.  She is open to complete STI panel.    Past Medical History:  Diagnosis Date   Chlamydia    Medical history non-contributory     Patient Active Problem List   Diagnosis Date Noted   Active labor at term 01/02/2014   Chlamydia infection complicating pregnancy in second trimester 08/10/2013   Flu vaccine need 08/09/2013    Supervision of normal pregnancy 08/09/2013    Past Surgical History:  Procedure Laterality Date   WISDOM TOOTH EXTRACTION      OB History     Gravida  3   Para  1   Term  1   Preterm      AB  2   Living  1      SAB  0   IAB  2   Ectopic      Multiple      Live Births  1            Home Medications    Prior to Admission medications   Not on File    Family History Family History  Problem Relation Age of Onset   Hypertension Mother    Diabetes Maternal Grandmother    Hypertension Maternal Grandmother     Social History Social History   Tobacco Use   Smoking status: Some Days    Current packs/day: 0.25    Average packs/day: 0.3 packs/day for 5.0 years (1.3 ttl pk-yrs)    Types: Cigarettes   Smokeless tobacco: Never  Vaping Use   Vaping status: Former  Substance Use Topics   Alcohol use: Yes    Comment: Social   Drug use: No    Types: Marijuana    Comment: none in many years     Allergies   Patient has no known allergies.   Review of Systems Review of Systems  Constitutional:  Positive for activity change. Negative for appetite change,  fatigue and fever.  Respiratory:  Negative for cough and shortness of breath.   Cardiovascular:  Positive for palpitations. Negative for chest pain and leg swelling.  Gastrointestinal:  Positive for blood in stool (Intermittent with last episode about 1 month ago). Negative for abdominal pain, constipation, diarrhea, nausea and vomiting.  Genitourinary:  Positive for vaginal discharge. Negative for vaginal bleeding and vaginal pain.  Musculoskeletal:  Negative for arthralgias and myalgias.  Neurological:  Positive for light-headedness. Negative for dizziness, seizures, syncope, facial asymmetry, speech difficulty, weakness and headaches.     Physical Exam Triage Vital Signs ED Triage Vitals  Encounter Vitals Group     BP 04/15/24 1351 133/83     Girls Systolic BP Percentile --      Girls  Diastolic BP Percentile --      Boys Systolic BP Percentile --      Boys Diastolic BP Percentile --      Pulse Rate 04/15/24 1351 (!) 110     Resp 04/15/24 1351 18     Temp 04/15/24 1351 98.5 F (36.9 C)     Temp Source 04/15/24 1351 Oral     SpO2 04/15/24 1351 97 %     Weight --      Height --      Head Circumference --      Peak Flow --      Pain Score 04/15/24 1348 3     Pain Loc --      Pain Education --      Exclude from Growth Chart --    Orthostatic VS for the past 24 hrs:  BP- Lying Pulse- Lying BP- Standing at 0 minutes Pulse- Standing at 0 minutes  04/15/24 1445 126/87 85 132/90 104    Updated Vital Signs BP 133/83 (BP Location: Left Arm)   Pulse (!) 110   Temp 98.5 F (36.9 C) (Oral)   Resp 18   LMP 04/05/2024 (Approximate)   SpO2 97%   Visual Acuity Right Eye Distance:   Left Eye Distance:   Bilateral Distance:    Right Eye Near:   Left Eye Near:    Bilateral Near:     Physical Exam Vitals reviewed.  Constitutional:      General: She is awake. She is not in acute distress.    Appearance: Normal appearance. She is well-developed. She is not ill-appearing.     Comments: Very pleasant female appears stated age in no acute distress sitting comfortably in exam room  HENT:     Head: Normocephalic and atraumatic. No raccoon eyes, Battle's sign or contusion.     Right Ear: Tympanic membrane, ear canal and external ear normal. No hemotympanum.     Left Ear: Tympanic membrane, ear canal and external ear normal. No hemotympanum.     Nose: Nose normal.     Mouth/Throat:     Tongue: Tongue does not deviate from midline.     Pharynx: Uvula midline. No oropharyngeal exudate or posterior oropharyngeal erythema.  Eyes:     Extraocular Movements: Extraocular movements intact.     Pupils: Pupils are equal, round, and reactive to light.  Cardiovascular:     Rate and Rhythm: Regular rhythm. Tachycardia present.     Heart sounds: Normal heart sounds, S1 normal and S2  normal. No murmur heard.    Comments: Negative Homans' sign bilaterally Pulmonary:     Effort: Pulmonary effort is normal.     Breath sounds: Normal breath sounds. No wheezing, rhonchi or  rales.     Comments: Clear to auscultation bilaterally Abdominal:     Palpations: Abdomen is soft.     Tenderness: There is no abdominal tenderness.  Genitourinary:    Rectum: Guaiac result negative. No tenderness, external hemorrhoid or internal hemorrhoid.     Comments: Joss, RN present as chaperone during exam. Musculoskeletal:     Cervical back: No spinous process tenderness or muscular tenderness.     Right lower leg: No edema.     Left lower leg: No edema.     Comments: Strength 5/5 bilateral upper and lower extremities  Neurological:     General: No focal deficit present.     Mental Status: She is alert and oriented to person, place, and time.     Cranial Nerves: Cranial nerves 2-12 are intact.     Motor: Motor function is intact.     Coordination: Coordination is intact.     Gait: Gait is intact.     Comments: Cranial nerves II through XII grossly intact.  No focal neurologic defect on exam.  Psychiatric:        Behavior: Behavior is cooperative.      UC Treatments / Results  Labs (all labs ordered are listed, but only abnormal results are displayed) Labs Reviewed  GLUCOSE, POCT (MANUAL RESULT ENTRY) - Abnormal; Notable for the following components:      Result Value   POCT Glucose ([facility]) 120 (*)    All other components within normal limits  POCT URINE DIPSTICK - Abnormal; Notable for the following components:   Color, UA other (*)    Clarity, UA cloudy (*)    Bilirubin, UA small (*)    Ketones, POC UA trace (5) (*)    Spec Grav, UA >=1.030 (*)    POC PROTEIN,UA =30 (*)    All other components within normal limits  POC HEMOCCULT BLD/STL (OFFICE/1-CARD/DIAGNOSTIC) - Normal  POCT URINE PREGNANCY - Normal  CBC WITH DIFFERENTIAL/PLATELET  COMPREHENSIVE METABOLIC PANEL WITH GFR   TSH  CERVICOVAGINAL ANCILLARY ONLY    EKG   Radiology No results found.  Procedures Procedures (including critical care time)  Medications Ordered in UC Medications - No data to display  Initial Impression / Assessment and Plan / UC Course  I have reviewed the triage vital signs and the nursing notes.  Pertinent labs & imaging results that were available during my care of the patient were reviewed by me and considered in my medical decision making (see chart for details).     Patient is well-appearing, afebrile, nontoxic.  She was initially tachycardic but this is improved after she sat quietly for several minutes.  Given her intermittent lightheadedness and associated elevated heart rate EKG was obtained that showed normal sinus rhythm with a ventricular rate of 81 bpm; no previous to compare.  Orthostatics vital signs were appropriate.  Her blood sugar was appropriate.  Her UA did show elevated specific gravity and we discussed that she should push fluids.  She is negative for pregnancy.  She did report intermittent blood in her stool with last episode about a month ago and so Hemoccult was obtained and was negative in clinic.  We did discuss that she should follow-up with gastroenterology and was given the contact information for local provider with instruction to call to schedule an appointment.  Basic blood work including CBC, CMP, TSH was obtained and is pending.  She is to drink plenty fluid and eat small frequent meals.  Recommend close follow-up  with her primary care.  We discussed that if anything worsens and she has recurrent episodes, passing out, weakness, nausea/vomiting, chest pain, shortness of breath she needs to be seen immediately.  Strict return precautions given.  Excuse note provided.  Will obtain cervical vaginal swab to investigate symptoms and we discussed that if anything is positive we will contact her to arrange treatment.  She is to wear loosefitting cotton  underwear and use hypoallergenic soaps and detergents.  If she develops any pelvic pain, abdominal pain, fever, nausea, vomiting she needs to be seen immediately.  Final Clinical Impressions(s) / UC Diagnoses   Final diagnoses:  Episodic lightheadedness  Tachycardia  Vaginal discharge  Symptom of blood in stool     Discharge Instructions      I will contact you if any of your blood work is abnormal.  Your EKG was reassuring.  There was not any blood in your stool today but I would like you to follow-up with a gastroenterologist so they can investigate this further.  Please call them to schedule an appointment.  Make sure that you are resting and drinking plenty of fluid as your urine did show that you are slightly dehydrated.  Wear loosefitting cotton underwear and use hypoallergenic soaps and detergents.  We will contact you if your swab is positive and we need to arrange additional treatment.  Please follow-up with your primary care as soon as possible for further evaluation and management.  If anything worsens and you have severe abdominal pain, nausea/vomiting interfere with oral intake, weakness, recurrent blood in your stool, pelvic pain, heart racing you need to be seen immediately.     ED Prescriptions   None    PDMP not reviewed this encounter.   Sherrell Rocky POUR, PA-C 04/15/24 1511

## 2024-04-15 NOTE — ED Triage Notes (Signed)
 Pt states she has been having episodes of feeling lightheaded about 2 times a week for the last few months.  She also reports increased vaginal discharge for the last few weeks clear. States her period has been irregular also, sometimes has 2 periods in a month. Reports lower back/abdominal pain as well. Denies dysuria but states she feels like she is going more frequently.

## 2024-04-16 LAB — COMPREHENSIVE METABOLIC PANEL WITH GFR
ALT: 12 IU/L (ref 0–32)
AST: 19 IU/L (ref 0–40)
Albumin: 4.4 g/dL (ref 3.9–4.9)
Alkaline Phosphatase: 69 IU/L (ref 41–116)
BUN/Creatinine Ratio: 14 (ref 9–23)
BUN: 13 mg/dL (ref 6–20)
Bilirubin Total: 0.7 mg/dL (ref 0.0–1.2)
CO2: 17 mmol/L — ABNORMAL LOW (ref 20–29)
Calcium: 9.1 mg/dL (ref 8.7–10.2)
Chloride: 104 mmol/L (ref 96–106)
Creatinine, Ser: 0.93 mg/dL (ref 0.57–1.00)
Globulin, Total: 2.4 g/dL (ref 1.5–4.5)
Glucose: 83 mg/dL (ref 70–99)
Potassium: 3.8 mmol/L (ref 3.5–5.2)
Sodium: 141 mmol/L (ref 134–144)
Total Protein: 6.8 g/dL (ref 6.0–8.5)
eGFR: 82 mL/min/1.73 (ref >59)

## 2024-04-16 LAB — CBC WITH DIFFERENTIAL/PLATELET
Basophils Absolute: 0 x10E3/uL (ref 0.0–0.2)
Basos: 0 %
EOS (ABSOLUTE): 0.1 x10E3/uL (ref 0.0–0.4)
Eos: 1 %
Hematocrit: 37 % (ref 34.0–46.6)
Hemoglobin: 11.8 g/dL (ref 11.1–15.9)
Immature Grans (Abs): 0 x10E3/uL (ref 0.0–0.1)
Immature Granulocytes: 0 %
Lymphocytes Absolute: 3.3 x10E3/uL — ABNORMAL HIGH (ref 0.7–3.1)
Lymphs: 36 %
MCH: 28.6 pg (ref 26.6–33.0)
MCHC: 31.9 g/dL (ref 31.5–35.7)
MCV: 90 fL (ref 79–97)
Monocytes Absolute: 0.7 x10E3/uL (ref 0.1–0.9)
Monocytes: 7 %
Neutrophils Absolute: 5.1 x10E3/uL (ref 1.4–7.0)
Neutrophils: 56 %
Platelets: 270 x10E3/uL (ref 150–450)
RBC: 4.12 x10E6/uL (ref 3.77–5.28)
RDW: 13 % (ref 11.7–15.4)
WBC: 9.3 x10E3/uL (ref 3.4–10.8)

## 2024-04-16 LAB — TSH: TSH: 0.792 u[IU]/mL (ref 0.450–4.500)

## 2024-04-17 ENCOUNTER — Ambulatory Visit (HOSPITAL_COMMUNITY): Payer: Self-pay | Admitting: Physician Assistant

## 2024-04-18 LAB — CERVICOVAGINAL ANCILLARY ONLY
Bacterial Vaginitis (gardnerella): NEGATIVE
Candida Glabrata: NEGATIVE
Candida Vaginitis: NEGATIVE
Chlamydia: NEGATIVE
Comment: NEGATIVE
Comment: NEGATIVE
Comment: NEGATIVE
Comment: NEGATIVE
Comment: NEGATIVE
Comment: NORMAL
Neisseria Gonorrhea: NEGATIVE
Trichomonas: NEGATIVE
